# Patient Record
Sex: Male | Born: 1945
Health system: Southern US, Community
[De-identification: ages and names within clinical notes are randomized; demographics above are authoritative.]

## PROBLEM LIST (undated history)

## (undated) DIAGNOSIS — I2510 Atherosclerotic heart disease of native coronary artery without angina pectoris: Secondary | ICD-10-CM

## (undated) DIAGNOSIS — E785 Hyperlipidemia, unspecified: Secondary | ICD-10-CM

## (undated) DIAGNOSIS — I10 Essential (primary) hypertension: Secondary | ICD-10-CM

## (undated) DIAGNOSIS — E119 Type 2 diabetes mellitus without complications: Secondary | ICD-10-CM

## (undated) DIAGNOSIS — C801 Malignant (primary) neoplasm, unspecified: Secondary | ICD-10-CM

## (undated) HISTORY — DX: Hyperlipidemia, unspecified: E78.5

## (undated) HISTORY — PX: HERNIA REPAIR: SHX51

## (undated) HISTORY — DX: Essential (primary) hypertension: I10

## (undated) HISTORY — PX: CARDIAC CATHETERIZATION: SHX172

## (undated) HISTORY — DX: Type 2 diabetes mellitus without complications: E11.9

## (undated) HISTORY — PX: SHOULDER SURGERY: SHX246

## (undated) HISTORY — PX: BLADDER SURGERY: SHX569

## (undated) HISTORY — DX: Malignant (primary) neoplasm, unspecified: C80.1

## (undated) HISTORY — DX: Atherosclerotic heart disease of native coronary artery without angina pectoris: I25.10

---

## 2001-09-13 ENCOUNTER — Encounter (INDEPENDENT_AMBULATORY_CARE_PROVIDER_SITE_OTHER): Payer: Self-pay | Admitting: Specialist

## 2001-09-13 ENCOUNTER — Ambulatory Visit (HOSPITAL_COMMUNITY): Admission: RE | Admit: 2001-09-13 | Discharge: 2001-09-13 | Payer: Self-pay | Admitting: *Deleted

## 2004-10-10 ENCOUNTER — Ambulatory Visit (HOSPITAL_COMMUNITY): Admission: RE | Admit: 2004-10-10 | Discharge: 2004-10-10 | Payer: Self-pay | Admitting: *Deleted

## 2008-08-14 ENCOUNTER — Encounter: Admission: RE | Admit: 2008-08-14 | Discharge: 2008-08-14 | Payer: Self-pay | Admitting: General Surgery

## 2008-08-17 ENCOUNTER — Ambulatory Visit (HOSPITAL_BASED_OUTPATIENT_CLINIC_OR_DEPARTMENT_OTHER): Admission: RE | Admit: 2008-08-17 | Discharge: 2008-08-17 | Payer: Self-pay | Admitting: General Surgery

## 2010-08-30 ENCOUNTER — Ambulatory Visit (HOSPITAL_BASED_OUTPATIENT_CLINIC_OR_DEPARTMENT_OTHER)
Admission: RE | Admit: 2010-08-30 | Discharge: 2010-08-30 | Disposition: A | Discharge status: Home / Self Care | Payer: Medicare Other | Source: Ambulatory Visit | Attending: Urology | Admitting: Urology

## 2010-08-30 ENCOUNTER — Other Ambulatory Visit: Payer: Self-pay | Admitting: Urology

## 2010-08-30 DIAGNOSIS — Z01810 Encounter for preprocedural cardiovascular examination: Secondary | ICD-10-CM | POA: Insufficient documentation

## 2010-08-30 DIAGNOSIS — Z01812 Encounter for preprocedural laboratory examination: Secondary | ICD-10-CM | POA: Insufficient documentation

## 2010-08-30 DIAGNOSIS — C679 Malignant neoplasm of bladder, unspecified: Secondary | ICD-10-CM | POA: Insufficient documentation

## 2010-08-30 LAB — POCT I-STAT 4, (NA,K, GLUC, HGB,HCT)
Glucose, Bld: 92 mg/dL (ref 70–99)
Hemoglobin: 15.3 g/dL (ref 13.0–17.0)
Potassium: 3.8 mEq/L (ref 3.5–5.1)

## 2010-08-30 LAB — GLUCOSE, CAPILLARY: Glucose-Capillary: 117 mg/dL — ABNORMAL HIGH (ref 70–99)

## 2010-09-02 NOTE — Op Note (Signed)
  NAME:  Logan Gonzales, FERRARIS NO.:  [id number]  MEDICAL RECORD NO.:  [id number]           PATIENT TYPE:  LOCATION:                                 FACILITY:  PHYSICIAN:  Maretta Bees. Vonita Moss, M.D.     DATE OF BIRTH:  DATE OF PROCEDURE:  08/30/2010 DATE OF DISCHARGE:                              OPERATIVE REPORT   PREOPERATIVE DIAGNOSIS:  Bladder carcinoma.  POSTOPERATIVE DIAGNOSIS:  Bladder carcinoma.  PROCEDURE:  Cystoscopy and transurethral resection of large bladder tumor and right retrograde pyelogram with interpretation.  SURGEON:  Maretta Bees. Vonita Moss, MD  ANESTHESIA:  General.  INDICATIONS:  This gentleman has had persistent microhematuria and a CT scan with contrast revealed normal upper tracts, but he had a large filling defect on the right bladder base.  There was no hydronephrosis. This was suspected to be a bladder carcinoma.  PROCEDURE:  The patient was brought to the operating room and placed inlithotomy position.  The external genitalia were prepped and draped in the usual fashion.  He was cystoscoped.  The anterior urethra was normal.  He had partial prostatic obstruction.  The bladder had mild trabeculation.  The ureteral orifices were normal.  Just lateral to the right ureteral orifice was a very large classical papillary looking tumor and fortunately, it appeared to have a narrow base.  It measured about 5 cm in size.  I then inserted the resectoscope after dilating the urethra to 30-French.  Fortunately, the tumor had a narrow stalk as suspected and I was able to resect this tumor at the base and then remove it through the resectoscope sheath.  I thoroughly fulgurated the biopsy site and surrounding mucosa.  At this point, there was excellent hemostasis and no evidence of residual tumor.  He only had about 1 cm mucosal lesion.  I felt that the ureter was fine, but nonetheless decided to do a retrograde pyelogram to be sure.  Using the cystoscope  and a cone-tip catheter inserted through the scope, I performed a right retrograde pyelogram that showed a delicate nonobstructed ureter with no filling defects and delicate calices.  At this point, the bladder was emptied, the scope was removed.  Blood loss was about 25 cc at most and he tolerated the procedure well.  He was taken to the recovery room in good condition.  Unfortunately, there is a back order on mitomycin C and I would have used that today, but I did not feel it was worth waiting for the medication to proceed with surgery.  Fortunately, with this small resected area, I believe we will probably be fine.     Maretta Bees. Vonita Moss, M.D.     LJP/MEDQ  D:  08/30/2010  T:  08/30/2010  Job:  [id number]  cc:   Brooke Bonito, M.D. Fax: [phone]  Electronically Signed by Larey Dresser M.D. on 09/02/2010 11:35:14 AM

## 2010-10-14 ENCOUNTER — Other Ambulatory Visit: Payer: Self-pay | Admitting: Urology

## 2010-10-14 ENCOUNTER — Ambulatory Visit (HOSPITAL_BASED_OUTPATIENT_CLINIC_OR_DEPARTMENT_OTHER)
Admission: RE | Admit: 2010-10-14 | Discharge: 2010-10-14 | Disposition: A | Discharge status: Home / Self Care | Payer: Medicare Other | Source: Ambulatory Visit | Attending: Urology | Admitting: Urology

## 2010-10-14 DIAGNOSIS — Z7982 Long term (current) use of aspirin: Secondary | ICD-10-CM | POA: Insufficient documentation

## 2010-10-14 DIAGNOSIS — E7800 Pure hypercholesterolemia, unspecified: Secondary | ICD-10-CM | POA: Insufficient documentation

## 2010-10-14 DIAGNOSIS — E119 Type 2 diabetes mellitus without complications: Secondary | ICD-10-CM | POA: Insufficient documentation

## 2010-10-14 DIAGNOSIS — Z01812 Encounter for preprocedural laboratory examination: Secondary | ICD-10-CM | POA: Insufficient documentation

## 2010-10-14 DIAGNOSIS — C679 Malignant neoplasm of bladder, unspecified: Secondary | ICD-10-CM | POA: Insufficient documentation

## 2010-10-14 DIAGNOSIS — I10 Essential (primary) hypertension: Secondary | ICD-10-CM | POA: Insufficient documentation

## 2010-10-14 DIAGNOSIS — Z79899 Other long term (current) drug therapy: Secondary | ICD-10-CM | POA: Insufficient documentation

## 2010-10-14 LAB — POCT I-STAT 4, (NA,K, GLUC, HGB,HCT)
Glucose, Bld: 114 mg/dL — ABNORMAL HIGH (ref 70–99)
HCT: 42 % (ref 39.0–52.0)
Sodium: 141 mEq/L (ref 135–145)

## 2010-10-18 NOTE — Op Note (Signed)
  NAME:  Logan Gonzales, Logan Gonzales               ACCOUNT NO.:  [id number]  MEDICAL RECORD NO.:  [id number]          PATIENT TYPE:  AMB  LOCATION:                               FACILITY:  [facility]  PHYSICIAN:  Maretta Bees. Vonita Moss, M.D.DATE OF BIRTH:  1945/08/23  DATE OF PROCEDURE:  10/14/2010 DATE OF DISCHARGE:                              OPERATIVE REPORT   PREOPERATIVE DIAGNOSIS:  Bladder carcinoma.  POSTOPERATIVE DIAGNOSIS:  Bladder carcinoma.  PROCEDURE:  Cystoscopy, cold cup bladder biopsy and resection of bladder tumors (1 cm on the right lateral floor and 1 cm over left intramural ureter), left ureteroscopy and instillation of mitomycin-C.  SURGEON:  Maretta Bees. Vonita Moss, MD  ANESTHESIA:  General.  INDICATIONS:  This 65 year old gentleman was worked up for persistent microhematuria and found to have a fairly long, large papillary bladder tumor on the right bladder floor that was resected in early February. He is brought back to the OR today to restage this tumor for deeper biopsies in the lamina propria to rule out any invasion.  PROCEDURE IN DETAIL:  The patient was brought to the operating room, placed in lithotomy position, and the external genitalia prepped and draped in the usual fashion.  He was cystoscoped and the anterior urethra was normal.  The prostatic urethra was unremarkable.  There was an obvious scar in the right lateral bladder floor beyond the right ureteral orifice where the previous tumor was resected.  There was 1 tiny mucosal area that could represent a small papillary area.  In any case, this previous biopsy site was biopsied with the cold cup bladder biopsy forceps and then fulgurated with the Bugbee electrode. Observation of the rest of the bladder revealed a new papillary tumor over the course of the left intramural ureter.  It was approximately 1 cm in size and prior to resection, I inserted a 3-French ureteral catheter up the left ureteral orifice.  I then used  the cold cup bladder biopsy forceps to completely resect this papillary tumor and also I used the Bugbee electrode to fulgurate the base and biopsy site of this lesion.  There was no evidence of any residual tumor.  I then removed the cystoscope and inserted a 6-French rigid ureteroscope.  Then after removing the ureteral catheter, I was easily able to visualize the intramural ureter with no evidence of papillary tumor.  The ureteroscope was then removed. A 16-French Foley catheter was inserted for instillation of 40 mg of mitomycin-C in the PACU.  Blood loss was essentially zero and he tolerated the procedure well.     Maretta Bees. Vonita Moss, M.D.     LJP/MEDQ  D:  10/14/2010  T:  10/14/2010  Job:  [id number]  cc:   Brooke Bonito, M.D. Fax: [phone]  Electronically Signed by Larey Dresser M.D. on 10/18/2010 08:13:23 PM

## 2010-10-31 LAB — COMPREHENSIVE METABOLIC PANEL
BUN: 20 mg/dL (ref 6–23)
CO2: 27 mEq/L (ref 19–32)
Calcium: 9.4 mg/dL (ref 8.4–10.5)
Chloride: 103 mEq/L (ref 96–112)
Creatinine, Ser: 0.95 mg/dL (ref 0.4–1.5)
GFR calc Af Amer: >60 mL/min (ref >60)
GFR calc non Af Amer: >60 mL/min (ref >60)
Total Bilirubin: 0.8 mg/dL (ref 0.3–1.2)

## 2010-10-31 LAB — DIFFERENTIAL
Basophils Absolute: 0 10*3/uL (ref 0.0–0.1)
Lymphocytes Relative: 31 % (ref 12–46)
Lymphs Abs: 1.3 10*3/uL (ref 0.7–4.0)
Neutro Abs: 2.6 10*3/uL (ref 1.7–7.7)
Neutrophils Relative %: 60 % (ref 43–77)

## 2010-10-31 LAB — CBC
HCT: 44.6 % (ref 39.0–52.0)
MCHC: 33.8 g/dL (ref 30.0–36.0)
MCV: 92.7 fL (ref 78.0–100.0)
RBC: 4.82 MIL/uL (ref 4.22–5.81)
WBC: 4.3 10*3/uL (ref 4.0–10.5)

## 2010-11-01 LAB — GLUCOSE, CAPILLARY
Glucose-Capillary: 112 mg/dL — ABNORMAL HIGH (ref 70–99)
Glucose-Capillary: 160 mg/dL — ABNORMAL HIGH (ref 70–99)
Glucose-Capillary: 166 mg/dL — ABNORMAL HIGH (ref 70–99)

## 2010-11-29 NOTE — Op Note (Signed)
NAMELATHYN, GRIGGS NO.:  [id number]   MEDICAL RECORD NO.:  [id number]          PATIENT TYPE:  AMB   LOCATION:  DSC                          FACILITY:  [facility]   PHYSICIAN:  Anselm Pancoast. Weatherly, M.D.DATE OF BIRTH:  December 21, 1945   DATE OF PROCEDURE:  08/17/2008  DATE OF DISCHARGE:                               OPERATIVE REPORT   PREOPERATIVE DIAGNOSIS:  Right inguinal hernia, probably a pantaloon  umbilical hernia and then 3 lipomas anterior chest.   OPERATION:  Repair of right inguinal hernia, is pantaloon direct and  indirect combination, repair of umbilical hernia and excision of 3  lipomas.   ANESTHESIA:  General anesthesia.   SURGEON:  Anselm Pancoast. Zachery Dakins, MD   ASSISTANT:  Nurse.   HISTORY:  Logan Gonzales is a 65 year old male referred to me by Dr.  Juleen China for some bulges.  I had removed multiple lipomas in the minor  surgery room approximately in 2005 and he had a mass in the right groin  that he thought was possibly a large lipoma.  Dr. Juleen China said it was  obviously a hernia and the patient's weight has gained up to about 238  pounds at this time.  He also does have some lipomas over the anterior  lower chest sternal area that are mildly symptomatic and on physical  exam he has got about 50% bulge at the umbilicus in addition to the much  larger hernia in the right groin.  I recommended that we repair the  hernias with general anesthesia with mesh and he is here for the planned  procedure.  We will remove the lipoma simultaneously.  The patient was  given a gram of Ancef.  He is a diabetic, on oral medications and was  taken back to the operative suite after induction of general anesthesia  and endotracheal tube.  The areas had been marked with the hernias,  lipomas were encircled and we prepped with Betadine surgical scrub and  solution and draped widely in a sterile manner.  I had used towels to  start with the right inguinal hernia and the patient's  incision was made  after the ilioinguinal nerve block had been made with approximately 10  mL of 0.5% Marcaine with adrenaline.  Sharp dissection down through the  adipose tissue identifying the external oblique.  There were 2  superficial veins that were clamped, divided, and ligated with Vicryl.  The external oblique was then opened up and the cord structures were  elevated and a Penrose placed under it.  He has a lot of fatty material  in the cords but also an indirect hernia about 4 inches or so in length  and this was separated from the cord structures.  The hernia sac opened.  The fatty tissue that had prolapsed or herniated down was placed back  into peritoneal cavity and 0 Surgilon was used to do a high sac ligation  and a second suture just distal to the first.  I then anesthetized the  peritoneum, removed the hernia sac, and then elected to remove a  little  bit of the fatty tissue from the cord structures, but the veins and  vessels were all such that I felt that it would most likely cause  testicular swelling if we try to really skeletonize it.  So, I divided  the cremaster fibers and then repaired the floor which was kind of early  direct hernia with a running 2-0 Prolene, recreating the internal ring,  then going back tying the second layer with the similar 2-0 Prolene.  Next, a piece of Prolene mesh, shaped like a sail and slit  laterally  was used.  The ilioinguinal nerve and iliohypogastric nerves had all  been protected and the mesh was placed under these starting at the  symphysis pubis, suturing the inferior limb with a running 2-0 Prolene  and 2 tails around the internal ring sutured together laterally, and the  mesh was lying under the hypogastric nerve.  The superior flap was  sutured down with interrupted sutures of 2-0 Prolene, then the external  oblique was closed with a running 2-0 Vicryl.  I did place Marcaine in  the floor in addition to the ilioinguinal nerve  area.  The subcutaneous  Scarpa fascia was closed with interrupted 3-0 Vicryl and then 5-0 Dexon  subcuticular and Benzoin and Steri-Strips on the skin.  At the  completion of surgery, the testicle was in its normal position.  Next, I  made a little incision below the umbilicus and the herniated fatty  tissue was separated.  I did not actually go into the true peritoneum  but removed the preperitoneal fat and little pedicles were tied with 3-0  Vicryl and then I used a little piece of the Prolene mesh about 2 inches  to suture up under the fascia with 0 Surgilon sutures laterally and  inferior and then closed the little fascial defect over this  incorporating the little bit of mesh in the midline to reinforce and  bolster.  The subcutaneous tissue was closed with 3-0 Vicryl and then  some interrupted 5-0 Vicryl subcuticular and Steri-Strips on the  umbilicus.  Next, the lipomas and there were basically 3, turned out  there were 4.  One was to the left and slightly below the lowest rib  cage area and it was removed with a little skin incision and just kind  of dissecting the area out and the little vessels were coagulated and  then this was closed with 5-0 Vicryl subcuticular interrupted sutures  and then the other area were actually 3 that I used a single incision,  the incision was about an inch and a half and all of these were removed  and then the subcutaneous tissue was closed with 4-0 Vicryl and then  some interrupted 5-0 Vicryl and Benzoin and Steri-Strips on the skin  incision.  The patient tolerated the procedure nicely.  I had placed a  little bit of Marcaine in the lipoma incisions, and he was then  extubated and sent to recovery room in stable postop condition.  He will  be discharged in a short time and I will see him back in the office in  approximately 10 days.           ______________________________  Anselm Pancoast. Zachery Dakins, M.D.     WJW/MEDQ  D:  08/17/2008  T:   08/17/2008  Job:  [id number]   cc:   Brooke Bonito, M.D.

## 2010-12-02 NOTE — Procedures (Signed)
[facility]  Patient:    Logan Gonzales, Logan Gonzales Visit Number: [id number] MRN: [id number]          Service Type: END Location: ENDO Attending Physician:  Sabino Gasser Dictated by:   Sabino Gasser, M.D. Admit Date:  09/13/2001                             Procedure Report  PROCEDURE:  Colonoscopy.  SURGEON:  Sabino Gasser, M.D.  INDICATIONS:  Colon cancer screening, polyp at 30 cm.  ANESTHESIA:  Demerol 90 and Versed 9 mg.  DESCRIPTION OF PROCEDURE:  With the patient mildly sedated in the left lateral decubitus position, the Olympus videoscopic colonoscope was inserted into the rectum and passed under direct vision to the cecum, identified by the ileocecal valve and appendiceal orifice, both of which were photographed. From this point, the colonoscope was slowly withdrawn, taking circumferential views of the entire colonic mucosa, stopping to photograph diverticula seen along the way which was fairly mild in the sigmoid colon and a small polyp that was photographed and removed using hot biopsy forceps technique, setting at 3 and 3 blunted current.  The endoscope was withdrawn all the way to the rectum which appeared normal on direct and retroflexed view.  The endoscope was straightened and withdrawn.  The patients vital signs and pulse oximeter remained stable.  The patient tolerated the procedure well without apparent complications.  FINDINGS:  Rare diverticulum of the sigmoid colon, small polyp at 30 cm, otherwise unremarkable examination.  PLAN:  Repeat examination in approximately three to five years. Dictated by:   Sabino Gasser, M.D. Attending Physician:  Sabino Gasser DD:  09/13/01 TD:  09/13/01 Job: [id number]

## 2010-12-02 NOTE — Op Note (Signed)
NAMEELRIDGE, STEMM NO.:  [id number]   MEDICAL RECORD NO.:  [id number]          PATIENT TYPE:  AMB   LOCATION:  ENDO                         FACILITY:  [facility]   PHYSICIAN:  Georgiana Spinner, M.D.    DATE OF BIRTH:  Sep 16, 1945   DATE OF PROCEDURE:  10/10/2004  DATE OF DISCHARGE:                                 OPERATIVE REPORT   PROCEDURE:  Colonoscopy.   ENDOSCOPIST:  Georgiana Spinner, M.D.   INDICATIONS FOR PROCEDURE:  Polyps.   ANESTHESIA:  Demerol 80 mg, Versed 8 mg.   DESCRIPTION OF PROCEDURE:  With the patient mildly sedated in the left  lateral decubitus position, a rectal examination was performed which was  unremarkable.  Subsequently the Olympus videoscopic colonoscope was inserted  into the rectum and passed under direct vision to the cecum, identified by  the ileocecal valve and appendiceal orifice, both of which were  photographed.  From this point the colonoscope was slowly withdrawn, taking  circumferential views of the colonic mucosa, stopping in the rectum, which  appeared normal on direct and retroflexed view.  The endoscope was  straightened and withdrawn.  The patient's vital signs and pulse oximeter  remained stable.   The patient tolerated the procedure well without apparent complications.   FINDINGS:  Unremarkable examination.   PLAN:  Repeat examination in five years.      GMO/MEDQ  D:  10/10/2004  T:  10/10/2004  Job:  [id number]

## 2012-07-21 ENCOUNTER — Ambulatory Visit (INDEPENDENT_AMBULATORY_CARE_PROVIDER_SITE_OTHER): Payer: Medicare Other | Admitting: Internal Medicine

## 2012-07-21 VITALS — BP 148/76 | HR 71 | Temp 98.3°F | Resp 18 | Ht 73.5 in | Wt 212.6 lb

## 2012-07-21 DIAGNOSIS — E119 Type 2 diabetes mellitus without complications: Secondary | ICD-10-CM

## 2012-07-21 DIAGNOSIS — J40 Bronchitis, not specified as acute or chronic: Secondary | ICD-10-CM

## 2012-07-21 MED ORDER — HYDROCODONE-HOMATROPINE 5-1.5 MG/5ML PO SYRP: 5.0000 mL | ORAL_SOLUTION | Freq: Four times a day (QID) | ORAL | Status: AC | PRN

## 2012-07-21 MED ORDER — CIPROFLOXACIN HCL 500 MG PO TABS: 500.0000 mg | ORAL_TABLET | Freq: Two times a day (BID) | ORAL | Status: DC

## 2012-07-21 NOTE — Patient Instructions (Addendum)

## 2012-07-21 NOTE — Progress Notes (Signed)
  Subjective:    Patient ID: Logan Gonzales, male    DOB: 1945-08-18, 67 y.o.   MRN: [id number]  HPI two-day history of a productive cough with fever on medicines for diabetes Has had the flu shot No sinus symptoms/no sore throat/concerned that he will get pneumonia     Review of Systems No night sweats No nausea vomiting No skin rash No headache No myalgias    Objective:   Physical Exam TMs clear/nares clear/throat clear Chest with rhonchi at the right base that clear with coughing No wheezing with forced expiration Heart regular/no murmur       Assessment & Plan:  Problem 1 probable flu modified by flu shot Problem 2 ? Bronchitis He prefers cipro /past problems with zith hycodan

## 2012-07-23 ENCOUNTER — Telehealth: Payer: Self-pay

## 2012-07-23 MED ORDER — BENZONATATE 100 MG PO CAPS: 100.0000 mg | ORAL_CAPSULE | Freq: Three times a day (TID) | ORAL | Status: DC | PRN

## 2012-07-23 NOTE — Telephone Encounter (Signed)
AMY ASKED PATIENT TO CALL BACK IF HE IS NOT FEELING BETTER AND HE IS CALLING BACK.  BEST# [phone]

## 2012-07-23 NOTE — Telephone Encounter (Signed)
Rx'ed tessalon perles. We cannot prescribe another narcotic cough syrup.  He should complete they antibiotics as prescribed unless symptoms are worsening, and then he would need to RTC for further evaluation

## 2012-07-23 NOTE — Telephone Encounter (Signed)
Patient advised.

## 2012-07-23 NOTE — Telephone Encounter (Signed)
Called patient, to see how he is feeling, he is c/o increased cough and has had no relief. He wants to know if he can try another cough medication, the Hycodan does not help much. He requested Cipro because the Z pack did not help him much in the past. He is asking also if he can try a different antibiotic, please advise.

## 2013-04-30 ENCOUNTER — Other Ambulatory Visit: Payer: Self-pay | Admitting: Gastroenterology

## 2013-08-05 ENCOUNTER — Ambulatory Visit: Payer: Medicare Other

## 2015-09-13 ENCOUNTER — Ambulatory Visit: Payer: Self-pay | Admitting: Surgery

## 2016-08-22 DIAGNOSIS — H903 Sensorineural hearing loss, bilateral: Secondary | ICD-10-CM | POA: Diagnosis not present

## 2016-09-28 DIAGNOSIS — E789 Disorder of lipoprotein metabolism, unspecified: Secondary | ICD-10-CM | POA: Diagnosis not present

## 2016-09-28 DIAGNOSIS — E118 Type 2 diabetes mellitus with unspecified complications: Secondary | ICD-10-CM | POA: Diagnosis not present

## 2016-10-02 DIAGNOSIS — C678 Malignant neoplasm of overlapping sites of bladder: Secondary | ICD-10-CM | POA: Diagnosis not present

## 2016-10-05 DIAGNOSIS — I10 Essential (primary) hypertension: Secondary | ICD-10-CM | POA: Diagnosis not present

## 2016-10-05 DIAGNOSIS — E118 Type 2 diabetes mellitus with unspecified complications: Secondary | ICD-10-CM | POA: Diagnosis not present

## 2016-10-05 DIAGNOSIS — E789 Disorder of lipoprotein metabolism, unspecified: Secondary | ICD-10-CM | POA: Diagnosis not present

## 2017-01-02 DIAGNOSIS — E789 Disorder of lipoprotein metabolism, unspecified: Secondary | ICD-10-CM | POA: Diagnosis not present

## 2017-01-02 DIAGNOSIS — I10 Essential (primary) hypertension: Secondary | ICD-10-CM | POA: Diagnosis not present

## 2017-01-02 DIAGNOSIS — E118 Type 2 diabetes mellitus with unspecified complications: Secondary | ICD-10-CM | POA: Diagnosis not present

## 2017-01-09 DIAGNOSIS — E118 Type 2 diabetes mellitus with unspecified complications: Secondary | ICD-10-CM | POA: Diagnosis not present

## 2017-01-09 DIAGNOSIS — E789 Disorder of lipoprotein metabolism, unspecified: Secondary | ICD-10-CM | POA: Diagnosis not present

## 2017-01-19 DIAGNOSIS — D239 Other benign neoplasm of skin, unspecified: Secondary | ICD-10-CM | POA: Diagnosis not present

## 2017-01-19 DIAGNOSIS — D1801 Hemangioma of skin and subcutaneous tissue: Secondary | ICD-10-CM | POA: Diagnosis not present

## 2017-01-19 DIAGNOSIS — L821 Other seborrheic keratosis: Secondary | ICD-10-CM | POA: Diagnosis not present

## 2017-01-19 DIAGNOSIS — L814 Other melanin hyperpigmentation: Secondary | ICD-10-CM | POA: Diagnosis not present

## 2017-01-19 DIAGNOSIS — L570 Actinic keratosis: Secondary | ICD-10-CM | POA: Diagnosis not present

## 2017-01-19 DIAGNOSIS — L820 Inflamed seborrheic keratosis: Secondary | ICD-10-CM | POA: Diagnosis not present

## 2017-04-11 DIAGNOSIS — C678 Malignant neoplasm of overlapping sites of bladder: Secondary | ICD-10-CM | POA: Diagnosis not present

## 2017-04-11 DIAGNOSIS — N401 Enlarged prostate with lower urinary tract symptoms: Secondary | ICD-10-CM | POA: Diagnosis not present

## 2017-04-11 DIAGNOSIS — R351 Nocturia: Secondary | ICD-10-CM | POA: Diagnosis not present

## 2017-04-27 DIAGNOSIS — Z23 Encounter for immunization: Secondary | ICD-10-CM | POA: Diagnosis not present

## 2017-05-07 DIAGNOSIS — E118 Type 2 diabetes mellitus with unspecified complications: Secondary | ICD-10-CM | POA: Diagnosis not present

## 2017-05-07 DIAGNOSIS — I10 Essential (primary) hypertension: Secondary | ICD-10-CM | POA: Diagnosis not present

## 2017-05-24 DIAGNOSIS — I10 Essential (primary) hypertension: Secondary | ICD-10-CM | POA: Diagnosis not present

## 2017-05-24 DIAGNOSIS — E789 Disorder of lipoprotein metabolism, unspecified: Secondary | ICD-10-CM | POA: Diagnosis not present

## 2017-05-24 DIAGNOSIS — E118 Type 2 diabetes mellitus with unspecified complications: Secondary | ICD-10-CM | POA: Diagnosis not present

## 2017-06-11 DIAGNOSIS — H109 Unspecified conjunctivitis: Secondary | ICD-10-CM | POA: Diagnosis not present

## 2017-07-13 DIAGNOSIS — H5201 Hypermetropia, right eye: Secondary | ICD-10-CM | POA: Diagnosis not present

## 2017-07-13 DIAGNOSIS — H25013 Cortical age-related cataract, bilateral: Secondary | ICD-10-CM | POA: Diagnosis not present

## 2017-07-13 DIAGNOSIS — E119 Type 2 diabetes mellitus without complications: Secondary | ICD-10-CM | POA: Diagnosis not present

## 2017-07-13 DIAGNOSIS — H5212 Myopia, left eye: Secondary | ICD-10-CM | POA: Diagnosis not present

## 2017-07-31 DIAGNOSIS — E118 Type 2 diabetes mellitus with unspecified complications: Secondary | ICD-10-CM | POA: Diagnosis not present

## 2017-07-31 DIAGNOSIS — I10 Essential (primary) hypertension: Secondary | ICD-10-CM | POA: Diagnosis not present

## 2017-07-31 DIAGNOSIS — Z125 Encounter for screening for malignant neoplasm of prostate: Secondary | ICD-10-CM | POA: Diagnosis not present

## 2017-08-07 DIAGNOSIS — Z0001 Encounter for general adult medical examination with abnormal findings: Secondary | ICD-10-CM | POA: Diagnosis not present

## 2017-08-07 DIAGNOSIS — Z86010 Personal history of colonic polyps: Secondary | ICD-10-CM | POA: Diagnosis not present

## 2017-08-07 DIAGNOSIS — Z8551 Personal history of malignant neoplasm of bladder: Secondary | ICD-10-CM | POA: Diagnosis not present

## 2017-08-07 DIAGNOSIS — M5136 Other intervertebral disc degeneration, lumbar region: Secondary | ICD-10-CM | POA: Diagnosis not present

## 2017-08-07 DIAGNOSIS — E7800 Pure hypercholesterolemia, unspecified: Secondary | ICD-10-CM | POA: Diagnosis not present

## 2017-08-07 DIAGNOSIS — E1136 Type 2 diabetes mellitus with diabetic cataract: Secondary | ICD-10-CM | POA: Diagnosis not present

## 2017-08-07 DIAGNOSIS — I10 Essential (primary) hypertension: Secondary | ICD-10-CM | POA: Diagnosis not present

## 2017-08-07 DIAGNOSIS — M7741 Metatarsalgia, right foot: Secondary | ICD-10-CM | POA: Diagnosis not present

## 2017-08-14 DIAGNOSIS — Z23 Encounter for immunization: Secondary | ICD-10-CM | POA: Diagnosis not present

## 2017-08-14 DIAGNOSIS — S41111A Laceration without foreign body of right upper arm, initial encounter: Secondary | ICD-10-CM | POA: Diagnosis not present

## 2017-09-17 DIAGNOSIS — R351 Nocturia: Secondary | ICD-10-CM | POA: Diagnosis not present

## 2017-09-17 DIAGNOSIS — N401 Enlarged prostate with lower urinary tract symptoms: Secondary | ICD-10-CM | POA: Diagnosis not present

## 2017-09-17 DIAGNOSIS — C678 Malignant neoplasm of overlapping sites of bladder: Secondary | ICD-10-CM | POA: Diagnosis not present

## 2017-09-24 DIAGNOSIS — J399 Disease of upper respiratory tract, unspecified: Secondary | ICD-10-CM | POA: Diagnosis not present

## 2017-09-24 DIAGNOSIS — I10 Essential (primary) hypertension: Secondary | ICD-10-CM | POA: Diagnosis not present

## 2017-09-24 DIAGNOSIS — J22 Unspecified acute lower respiratory infection: Secondary | ICD-10-CM | POA: Diagnosis not present

## 2017-09-24 DIAGNOSIS — E1136 Type 2 diabetes mellitus with diabetic cataract: Secondary | ICD-10-CM | POA: Diagnosis not present

## 2017-09-24 DIAGNOSIS — J329 Chronic sinusitis, unspecified: Secondary | ICD-10-CM | POA: Diagnosis not present

## 2017-09-26 ENCOUNTER — Other Ambulatory Visit: Payer: Self-pay | Admitting: Plastic Surgery

## 2017-09-26 DIAGNOSIS — C44519 Basal cell carcinoma of skin of other part of trunk: Secondary | ICD-10-CM | POA: Diagnosis not present

## 2017-09-26 DIAGNOSIS — D485 Neoplasm of uncertain behavior of skin: Secondary | ICD-10-CM | POA: Diagnosis not present

## 2017-10-08 DIAGNOSIS — C4491 Basal cell carcinoma of skin, unspecified: Secondary | ICD-10-CM | POA: Diagnosis not present

## 2017-10-16 ENCOUNTER — Other Ambulatory Visit: Payer: Self-pay | Admitting: Plastic Surgery

## 2017-10-16 DIAGNOSIS — G8929 Other chronic pain: Secondary | ICD-10-CM | POA: Diagnosis not present

## 2017-10-16 DIAGNOSIS — M545 Low back pain: Secondary | ICD-10-CM | POA: Diagnosis not present

## 2017-10-16 DIAGNOSIS — C44519 Basal cell carcinoma of skin of other part of trunk: Secondary | ICD-10-CM | POA: Diagnosis not present

## 2017-12-06 DIAGNOSIS — E1136 Type 2 diabetes mellitus with diabetic cataract: Secondary | ICD-10-CM | POA: Diagnosis not present

## 2017-12-06 DIAGNOSIS — L255 Unspecified contact dermatitis due to plants, except food: Secondary | ICD-10-CM | POA: Diagnosis not present

## 2017-12-20 MED FILL — SHINGRIX VIAL KIT: 50 | 1 days supply | Qty: 1 | Fill #0

## 2018-02-22 MED FILL — SHINGRIX VIAL KIT: 50 | 1 days supply | Qty: 1 | Fill #1

## 2018-02-28 DIAGNOSIS — L239 Allergic contact dermatitis, unspecified cause: Secondary | ICD-10-CM | POA: Diagnosis not present

## 2018-03-25 DIAGNOSIS — N401 Enlarged prostate with lower urinary tract symptoms: Secondary | ICD-10-CM | POA: Diagnosis not present

## 2018-03-25 DIAGNOSIS — R351 Nocturia: Secondary | ICD-10-CM | POA: Diagnosis not present

## 2018-03-25 DIAGNOSIS — C678 Malignant neoplasm of overlapping sites of bladder: Secondary | ICD-10-CM | POA: Diagnosis not present

## 2018-04-11 DIAGNOSIS — Z23 Encounter for immunization: Secondary | ICD-10-CM | POA: Diagnosis not present

## 2018-04-11 DIAGNOSIS — E1136 Type 2 diabetes mellitus with diabetic cataract: Secondary | ICD-10-CM | POA: Diagnosis not present

## 2018-07-30 DIAGNOSIS — H43811 Vitreous degeneration, right eye: Secondary | ICD-10-CM | POA: Diagnosis not present

## 2018-07-30 DIAGNOSIS — H25813 Combined forms of age-related cataract, bilateral: Secondary | ICD-10-CM | POA: Diagnosis not present

## 2018-07-30 DIAGNOSIS — E119 Type 2 diabetes mellitus without complications: Secondary | ICD-10-CM | POA: Diagnosis not present

## 2018-08-02 DIAGNOSIS — D123 Benign neoplasm of transverse colon: Secondary | ICD-10-CM | POA: Diagnosis not present

## 2018-08-02 DIAGNOSIS — Z86010 Personal history of colonic polyps: Secondary | ICD-10-CM | POA: Diagnosis not present

## 2018-08-02 DIAGNOSIS — K635 Polyp of colon: Secondary | ICD-10-CM | POA: Diagnosis not present

## 2018-08-06 DIAGNOSIS — D123 Benign neoplasm of transverse colon: Secondary | ICD-10-CM | POA: Diagnosis not present

## 2018-08-06 DIAGNOSIS — K635 Polyp of colon: Secondary | ICD-10-CM | POA: Diagnosis not present

## 2018-08-22 DIAGNOSIS — I10 Essential (primary) hypertension: Secondary | ICD-10-CM | POA: Diagnosis not present

## 2018-08-22 DIAGNOSIS — Z125 Encounter for screening for malignant neoplasm of prostate: Secondary | ICD-10-CM | POA: Diagnosis not present

## 2018-08-22 DIAGNOSIS — E118 Type 2 diabetes mellitus with unspecified complications: Secondary | ICD-10-CM | POA: Diagnosis not present

## 2018-08-29 DIAGNOSIS — E118 Type 2 diabetes mellitus with unspecified complications: Secondary | ICD-10-CM | POA: Diagnosis not present

## 2018-08-29 DIAGNOSIS — Z0001 Encounter for general adult medical examination with abnormal findings: Secondary | ICD-10-CM | POA: Diagnosis not present

## 2018-08-29 DIAGNOSIS — I10 Essential (primary) hypertension: Secondary | ICD-10-CM | POA: Diagnosis not present

## 2018-09-02 DIAGNOSIS — E7800 Pure hypercholesterolemia, unspecified: Secondary | ICD-10-CM | POA: Diagnosis not present

## 2018-09-02 DIAGNOSIS — E118 Type 2 diabetes mellitus with unspecified complications: Secondary | ICD-10-CM | POA: Diagnosis not present

## 2018-09-02 DIAGNOSIS — I10 Essential (primary) hypertension: Secondary | ICD-10-CM | POA: Diagnosis not present

## 2018-09-16 DIAGNOSIS — R21 Rash and other nonspecific skin eruption: Secondary | ICD-10-CM | POA: Diagnosis not present

## 2018-09-16 DIAGNOSIS — E1136 Type 2 diabetes mellitus with diabetic cataract: Secondary | ICD-10-CM | POA: Diagnosis not present

## 2018-09-16 DIAGNOSIS — I10 Essential (primary) hypertension: Secondary | ICD-10-CM | POA: Diagnosis not present

## 2018-09-16 DIAGNOSIS — J0100 Acute maxillary sinusitis, unspecified: Secondary | ICD-10-CM | POA: Diagnosis not present

## 2018-10-01 DIAGNOSIS — E118 Type 2 diabetes mellitus with unspecified complications: Secondary | ICD-10-CM | POA: Diagnosis not present

## 2018-10-01 DIAGNOSIS — E7800 Pure hypercholesterolemia, unspecified: Secondary | ICD-10-CM | POA: Diagnosis not present

## 2018-10-01 DIAGNOSIS — R21 Rash and other nonspecific skin eruption: Secondary | ICD-10-CM | POA: Diagnosis not present

## 2018-10-01 DIAGNOSIS — I10 Essential (primary) hypertension: Secondary | ICD-10-CM | POA: Diagnosis not present

## 2018-10-07 DIAGNOSIS — C678 Malignant neoplasm of overlapping sites of bladder: Secondary | ICD-10-CM | POA: Diagnosis not present

## 2018-11-27 DIAGNOSIS — Z20828 Contact with and (suspected) exposure to other viral communicable diseases: Secondary | ICD-10-CM | POA: Diagnosis not present

## 2019-04-08 DIAGNOSIS — E118 Type 2 diabetes mellitus with unspecified complications: Secondary | ICD-10-CM | POA: Diagnosis not present

## 2019-04-08 DIAGNOSIS — E7800 Pure hypercholesterolemia, unspecified: Secondary | ICD-10-CM | POA: Diagnosis not present

## 2019-04-08 DIAGNOSIS — I10 Essential (primary) hypertension: Secondary | ICD-10-CM | POA: Diagnosis not present

## 2019-04-14 DIAGNOSIS — Z8551 Personal history of malignant neoplasm of bladder: Secondary | ICD-10-CM | POA: Diagnosis not present

## 2019-04-18 DIAGNOSIS — Z23 Encounter for immunization: Secondary | ICD-10-CM | POA: Diagnosis not present

## 2019-05-26 DIAGNOSIS — W11XXXA Fall on and from ladder, initial encounter: Secondary | ICD-10-CM | POA: Diagnosis not present

## 2019-05-26 DIAGNOSIS — S299XXA Unspecified injury of thorax, initial encounter: Secondary | ICD-10-CM | POA: Diagnosis not present

## 2019-05-26 DIAGNOSIS — R0781 Pleurodynia: Secondary | ICD-10-CM | POA: Diagnosis not present

## 2019-07-01 DIAGNOSIS — E7800 Pure hypercholesterolemia, unspecified: Secondary | ICD-10-CM | POA: Diagnosis not present

## 2019-07-01 DIAGNOSIS — I10 Essential (primary) hypertension: Secondary | ICD-10-CM | POA: Diagnosis not present

## 2019-07-01 DIAGNOSIS — E118 Type 2 diabetes mellitus with unspecified complications: Secondary | ICD-10-CM | POA: Diagnosis not present

## 2019-07-08 DIAGNOSIS — E7800 Pure hypercholesterolemia, unspecified: Secondary | ICD-10-CM | POA: Diagnosis not present

## 2019-07-08 DIAGNOSIS — Z8551 Personal history of malignant neoplasm of bladder: Secondary | ICD-10-CM | POA: Diagnosis not present

## 2019-07-08 DIAGNOSIS — I10 Essential (primary) hypertension: Secondary | ICD-10-CM | POA: Diagnosis not present

## 2019-07-08 DIAGNOSIS — K5900 Constipation, unspecified: Secondary | ICD-10-CM | POA: Diagnosis not present

## 2019-07-08 DIAGNOSIS — R21 Rash and other nonspecific skin eruption: Secondary | ICD-10-CM | POA: Diagnosis not present

## 2019-07-08 DIAGNOSIS — E875 Hyperkalemia: Secondary | ICD-10-CM | POA: Diagnosis not present

## 2019-07-08 DIAGNOSIS — E1136 Type 2 diabetes mellitus with diabetic cataract: Secondary | ICD-10-CM | POA: Diagnosis not present

## 2019-07-23 DIAGNOSIS — C44629 Squamous cell carcinoma of skin of left upper limb, including shoulder: Secondary | ICD-10-CM | POA: Diagnosis not present

## 2019-07-23 DIAGNOSIS — D485 Neoplasm of uncertain behavior of skin: Secondary | ICD-10-CM | POA: Diagnosis not present

## 2019-07-23 DIAGNOSIS — I8311 Varicose veins of right lower extremity with inflammation: Secondary | ICD-10-CM | POA: Diagnosis not present

## 2019-07-23 DIAGNOSIS — I8312 Varicose veins of left lower extremity with inflammation: Secondary | ICD-10-CM | POA: Diagnosis not present

## 2019-07-23 DIAGNOSIS — L821 Other seborrheic keratosis: Secondary | ICD-10-CM | POA: Diagnosis not present

## 2019-07-28 DIAGNOSIS — D485 Neoplasm of uncertain behavior of skin: Secondary | ICD-10-CM | POA: Diagnosis not present

## 2019-07-28 DIAGNOSIS — C44319 Basal cell carcinoma of skin of other parts of face: Secondary | ICD-10-CM | POA: Diagnosis not present

## 2019-07-28 DIAGNOSIS — C44629 Squamous cell carcinoma of skin of left upper limb, including shoulder: Secondary | ICD-10-CM | POA: Diagnosis not present

## 2019-07-31 DIAGNOSIS — C44319 Basal cell carcinoma of skin of other parts of face: Secondary | ICD-10-CM | POA: Diagnosis not present

## 2019-08-01 DIAGNOSIS — H524 Presbyopia: Secondary | ICD-10-CM | POA: Diagnosis not present

## 2019-08-01 DIAGNOSIS — H43811 Vitreous degeneration, right eye: Secondary | ICD-10-CM | POA: Diagnosis not present

## 2019-08-01 DIAGNOSIS — H52223 Regular astigmatism, bilateral: Secondary | ICD-10-CM | POA: Diagnosis not present

## 2019-08-01 DIAGNOSIS — H25813 Combined forms of age-related cataract, bilateral: Secondary | ICD-10-CM | POA: Diagnosis not present

## 2019-08-01 DIAGNOSIS — E119 Type 2 diabetes mellitus without complications: Secondary | ICD-10-CM | POA: Diagnosis not present

## 2019-09-01 DIAGNOSIS — I10 Essential (primary) hypertension: Secondary | ICD-10-CM | POA: Diagnosis not present

## 2019-09-01 DIAGNOSIS — Z0000 Encounter for general adult medical examination without abnormal findings: Secondary | ICD-10-CM | POA: Diagnosis not present

## 2019-09-01 DIAGNOSIS — E1136 Type 2 diabetes mellitus with diabetic cataract: Secondary | ICD-10-CM | POA: Diagnosis not present

## 2019-09-01 DIAGNOSIS — Z125 Encounter for screening for malignant neoplasm of prostate: Secondary | ICD-10-CM | POA: Diagnosis not present

## 2019-09-01 DIAGNOSIS — E7800 Pure hypercholesterolemia, unspecified: Secondary | ICD-10-CM | POA: Diagnosis not present

## 2019-09-17 DIAGNOSIS — Z03818 Encounter for observation for suspected exposure to other biological agents ruled out: Secondary | ICD-10-CM | POA: Diagnosis not present

## 2019-09-25 DIAGNOSIS — Z0000 Encounter for general adult medical examination without abnormal findings: Secondary | ICD-10-CM | POA: Diagnosis not present

## 2019-09-25 DIAGNOSIS — J411 Mucopurulent chronic bronchitis: Secondary | ICD-10-CM | POA: Diagnosis not present

## 2019-09-25 DIAGNOSIS — E7800 Pure hypercholesterolemia, unspecified: Secondary | ICD-10-CM | POA: Diagnosis not present

## 2019-09-25 DIAGNOSIS — Z125 Encounter for screening for malignant neoplasm of prostate: Secondary | ICD-10-CM | POA: Diagnosis not present

## 2019-09-25 DIAGNOSIS — M5136 Other intervertebral disc degeneration, lumbar region: Secondary | ICD-10-CM | POA: Diagnosis not present

## 2019-09-25 DIAGNOSIS — E1136 Type 2 diabetes mellitus with diabetic cataract: Secondary | ICD-10-CM | POA: Diagnosis not present

## 2019-09-25 DIAGNOSIS — I10 Essential (primary) hypertension: Secondary | ICD-10-CM | POA: Diagnosis not present

## 2019-12-02 DIAGNOSIS — I10 Essential (primary) hypertension: Secondary | ICD-10-CM | POA: Diagnosis not present

## 2019-12-02 DIAGNOSIS — Z79899 Other long term (current) drug therapy: Secondary | ICD-10-CM | POA: Diagnosis not present

## 2019-12-02 DIAGNOSIS — E1136 Type 2 diabetes mellitus with diabetic cataract: Secondary | ICD-10-CM | POA: Diagnosis not present

## 2019-12-02 DIAGNOSIS — E7800 Pure hypercholesterolemia, unspecified: Secondary | ICD-10-CM | POA: Diagnosis not present

## 2020-01-05 DIAGNOSIS — C678 Malignant neoplasm of overlapping sites of bladder: Secondary | ICD-10-CM | POA: Diagnosis not present

## 2020-01-05 DIAGNOSIS — N401 Enlarged prostate with lower urinary tract symptoms: Secondary | ICD-10-CM | POA: Diagnosis not present

## 2020-01-05 DIAGNOSIS — R351 Nocturia: Secondary | ICD-10-CM | POA: Diagnosis not present

## 2020-01-15 DIAGNOSIS — M545 Low back pain: Secondary | ICD-10-CM | POA: Diagnosis not present

## 2020-02-05 DIAGNOSIS — M545 Low back pain: Secondary | ICD-10-CM | POA: Diagnosis not present

## 2020-02-26 DIAGNOSIS — M545 Low back pain: Secondary | ICD-10-CM | POA: Diagnosis not present

## 2020-03-03 ENCOUNTER — Other Ambulatory Visit: Payer: Self-pay | Admitting: Orthopedic Surgery

## 2020-03-03 DIAGNOSIS — M545 Low back pain, unspecified: Secondary | ICD-10-CM

## 2020-03-23 DIAGNOSIS — E1136 Type 2 diabetes mellitus with diabetic cataract: Secondary | ICD-10-CM | POA: Diagnosis not present

## 2020-03-23 DIAGNOSIS — I10 Essential (primary) hypertension: Secondary | ICD-10-CM | POA: Diagnosis not present

## 2020-03-23 DIAGNOSIS — E7800 Pure hypercholesterolemia, unspecified: Secondary | ICD-10-CM | POA: Diagnosis not present

## 2020-03-26 ENCOUNTER — Other Ambulatory Visit: Payer: Self-pay

## 2020-03-28 ENCOUNTER — Other Ambulatory Visit: Payer: Self-pay

## 2020-03-29 ENCOUNTER — Other Ambulatory Visit: Payer: Self-pay

## 2020-03-29 ENCOUNTER — Ambulatory Visit
Admission: RE | Admit: 2020-03-29 | Discharge: 2020-03-29 | Disposition: A | Discharge status: Home / Self Care | Payer: PPO | Source: Ambulatory Visit | Attending: Orthopedic Surgery | Admitting: Orthopedic Surgery

## 2020-03-29 DIAGNOSIS — E1136 Type 2 diabetes mellitus with diabetic cataract: Secondary | ICD-10-CM | POA: Diagnosis not present

## 2020-03-29 DIAGNOSIS — M5136 Other intervertebral disc degeneration, lumbar region: Secondary | ICD-10-CM | POA: Diagnosis not present

## 2020-03-29 DIAGNOSIS — E7800 Pure hypercholesterolemia, unspecified: Secondary | ICD-10-CM | POA: Diagnosis not present

## 2020-03-29 DIAGNOSIS — Z23 Encounter for immunization: Secondary | ICD-10-CM | POA: Diagnosis not present

## 2020-03-29 DIAGNOSIS — K5900 Constipation, unspecified: Secondary | ICD-10-CM | POA: Diagnosis not present

## 2020-03-29 DIAGNOSIS — M545 Low back pain, unspecified: Secondary | ICD-10-CM

## 2020-03-29 DIAGNOSIS — M48061 Spinal stenosis, lumbar region without neurogenic claudication: Secondary | ICD-10-CM | POA: Diagnosis not present

## 2020-03-29 DIAGNOSIS — Z8551 Personal history of malignant neoplasm of bladder: Secondary | ICD-10-CM | POA: Diagnosis not present

## 2020-03-29 DIAGNOSIS — I10 Essential (primary) hypertension: Secondary | ICD-10-CM | POA: Diagnosis not present

## 2020-03-29 DIAGNOSIS — Z86010 Personal history of colonic polyps: Secondary | ICD-10-CM | POA: Diagnosis not present

## 2020-04-01 DIAGNOSIS — M5441 Lumbago with sciatica, right side: Secondary | ICD-10-CM | POA: Diagnosis not present

## 2020-04-01 DIAGNOSIS — M5442 Lumbago with sciatica, left side: Secondary | ICD-10-CM | POA: Diagnosis not present

## 2020-04-01 DIAGNOSIS — M545 Low back pain: Secondary | ICD-10-CM | POA: Diagnosis not present

## 2020-04-21 ENCOUNTER — Other Ambulatory Visit: Payer: Self-pay

## 2020-06-01 DIAGNOSIS — J90 Pleural effusion, not elsewhere classified: Secondary | ICD-10-CM | POA: Diagnosis not present

## 2020-06-01 DIAGNOSIS — E7800 Pure hypercholesterolemia, unspecified: Secondary | ICD-10-CM | POA: Diagnosis not present

## 2020-06-01 DIAGNOSIS — E118 Type 2 diabetes mellitus with unspecified complications: Secondary | ICD-10-CM | POA: Diagnosis not present

## 2020-06-01 DIAGNOSIS — J069 Acute upper respiratory infection, unspecified: Secondary | ICD-10-CM | POA: Diagnosis not present

## 2020-06-01 DIAGNOSIS — I10 Essential (primary) hypertension: Secondary | ICD-10-CM | POA: Diagnosis not present

## 2020-06-14 ENCOUNTER — Other Ambulatory Visit: Payer: Self-pay

## 2020-06-14 ENCOUNTER — Ambulatory Visit: Payer: PPO | Admitting: Cardiology

## 2020-06-14 ENCOUNTER — Encounter: Payer: Self-pay | Admitting: Cardiology

## 2020-06-14 VITALS — BP 139/67 | HR 68 | Resp 16 | Ht 73.5 in | Wt 212.0 lb

## 2020-06-14 DIAGNOSIS — I700 Atherosclerosis of aorta: Secondary | ICD-10-CM | POA: Diagnosis not present

## 2020-06-14 DIAGNOSIS — Z8249 Family history of ischemic heart disease and other diseases of the circulatory system: Secondary | ICD-10-CM | POA: Diagnosis not present

## 2020-06-14 DIAGNOSIS — R9431 Abnormal electrocardiogram [ECG] [EKG]: Secondary | ICD-10-CM

## 2020-06-14 DIAGNOSIS — E119 Type 2 diabetes mellitus without complications: Secondary | ICD-10-CM

## 2020-06-14 DIAGNOSIS — I10 Essential (primary) hypertension: Secondary | ICD-10-CM

## 2020-06-14 DIAGNOSIS — E7800 Pure hypercholesterolemia, unspecified: Secondary | ICD-10-CM | POA: Diagnosis not present

## 2020-06-14 NOTE — Progress Notes (Signed)
Date:  06/28/2020   ID:  LJ MIYAMOTO, DOB 01-16-1946, MRN [id number]  PCP:  Dr. Deland Pretty Cardiologist:  Rex Kras, DO, [facility] (established care 06/14/2020) Former Cardiology Providers: NA  REASON FOR CONSULT: Aortic Atherosclerosis.   REQUESTING PHYSICIAN:  Deland Pretty, MD [address] [place],  [place] [address]  Chief Complaint  Patient presents with  . Aortic Arthersclerosis    on CXR  . New Patient (Initial Visit)    Referred by Deland Pretty, MD    HPI  Logan Gonzales is a 74 y.o. male who presents to the office with a chief complaint of "evaluation for aortic atherosclerosis.." Patient's past medical history and cardiovascular risk factors include: Hypertension, non-insulin-dependent diabetes mellitus type 2, hyperlipidemia, advanced age, hx of bladder cancer,  aortic atherosclerosis, family history of CAD, advanced age.  He is referred to the office at the request of Deland Pretty, MD for evaluation of aortic atherosclerosis.  Patient states that he recently had a chest x-ray with his PCP which noted aortic atherosclerosis and given his multiple cardiovascular risk factors referred to cardiology for further evaluation and management.  Patient is concerned that he may have underlying multivessel CAD given his family history of premature coronary artery disease and his risk factors of diabetes, hyperlipidemia, and advanced age.  He denies active chest pain at rest or with effort related activities.  He is overall euvolemic and not in congestive heart failure clinically.  Patient continues to walk 1 mile every other day and has not noticed any significant change in overall physical endurance.  Family history of CAD with Dad having MI at age of 66 but he was a heavy smoker.  FUNCTIONAL STATUS: Walks 1 mile every other day.    ALLERGIES: No Known Allergies  MEDICATION LIST PRIOR TO VISIT: Current Meds  Medication Sig  . amLODipine (NORVASC) 5 MG  tablet Take 5 mg by mouth daily.  Marland Kitchen ascorbic acid (VITAMIN C) 250 MG tablet Take by mouth.  Marland Kitchen aspirin EC 81 MG tablet Take 81 mg by mouth daily. Swallow whole.  Marland Kitchen glipiZIDE (GLUCOTROL) 10 MG tablet Take 5 mg by mouth 2 (two) times daily before a meal.  . hydrochlorothiazide (HYDRODIURIL) 12.5 MG tablet Take 1 tablet by mouth 2 (two) times daily.  Marland Kitchen JARDIANCE 25 MG TABS tablet Take 25 mg by mouth daily.  Marland Kitchen losartan (COZAAR) 50 MG tablet Take 50 mg by mouth 2 (two) times daily.  . metoprolol succinate (TOPROL-XL) 50 MG 24 hr tablet Take 50 mg by mouth daily.  . rosuvastatin (CRESTOR) 10 MG tablet Take 10 mg by mouth daily.  . SitaGLIPtin-MetFORMIN HCl (JANUMET XR) 50-1000 MG TB24 Take 1 tablet by mouth 2 (two) times daily.     PAST MEDICAL HISTORY: Past Medical History:  Diagnosis Date  . Cancer ([place])   . Diabetes mellitus without complication ([place])   . Hyperlipidemia   . Hypertension     PAST SURGICAL HISTORY: Past Surgical History:  Procedure Laterality Date  . BLADDER SURGERY    . HERNIA REPAIR    . SHOULDER SURGERY Right     FAMILY HISTORY: The patient family history includes Cancer in his brother, sister, and sister; Emphysema in his mother; Heart disease in his father.  SOCIAL HISTORY:  The patient  reports that he has never smoked. He has never used smokeless tobacco. He reports current alcohol use. He reports that he does not use drugs.  REVIEW OF SYSTEMS: Review of Systems  Constitutional: Negative for chills and fever.  HENT: Negative for hoarse voice and nosebleeds.   Eyes: Negative for discharge, double vision and pain.  Cardiovascular: Negative for chest pain, claudication, dyspnea on exertion, leg swelling, near-syncope, orthopnea, palpitations, paroxysmal nocturnal dyspnea and syncope.  Respiratory: Negative for hemoptysis and shortness of breath.   Musculoskeletal: Negative for muscle cramps and myalgias.  Gastrointestinal: Negative for abdominal pain,  constipation, diarrhea, hematemesis, hematochezia, melena, nausea and vomiting.  Neurological: Negative for dizziness and light-headedness.    PHYSICAL EXAM: Vitals with BMI 06/14/2020 07/21/2012  Height 6' 1.5" 6' 1.5"  Weight 212 lbs 212 lbs 10 oz  BMI 79.15 05.6  Systolic 979 480  Diastolic 67 76  Pulse 68 71   CONSTITUTIONAL: Well-developed and well-nourished. No acute distress.  SKIN: Skin is warm and dry. No rash noted. No cyanosis. No pallor. No jaundice HEAD: Normocephalic and atraumatic.  EYES: No scleral icterus MOUTH/THROAT: Moist oral membranes.  NECK: No JVD present. No thyromegaly noted. No carotid bruits  LYMPHATIC: No visible cervical adenopathy.  CHEST Normal respiratory effort. No intercostal retractions  LUNGS: Clear to auscultation bilaterally.  No stridor. No wheezes. No rales.  CARDIOVASCULAR: Regular rate and rhythm, positive X6-P5, soft holosystolic murmur heard at the apex, no gallops or rubs. ABDOMINAL: No apparent ascites.  EXTREMITIES: No peripheral edema  HEMATOLOGIC: No significant bruising NEUROLOGIC: Oriented to person, place, and time. Nonfocal. Normal muscle tone.  PSYCHIATRIC: Normal mood and affect. Normal behavior. Cooperative  Radiology: Chest x-ray 06/01/2020: Aortic atherosclerosis.  No radiographic evidence of acute cardiopulmonary disease.  CARDIAC DATABASE: EKG: 06/14/2020: Normal sinus, 70 bpm, T wave inversions in leads V3, V4, and III, without underlying injury pattern.  Echocardiogram: No results found for this or any previous visit from the past 1095 days.   Stress Testing: No results found for this or any previous visit from the past 1095 days.  Heart Catheterization: None  LABORATORY DATA: CBC Latest Ref Rng & Units 10/14/2010 08/30/2010 08/14/2008  WBC 4.0 - 10.5 K/uL - - 4.3  Hemoglobin 13.0 - 17.0 g/dL 14.3 15.3 15.1  Hematocrit 39.0 - 52.0 % 42.0 45.0 44.6  Platelets 150 - 400 K/uL - - 133(L)    CMP Latest Ref Rng &  Units 10/14/2010 08/30/2010 08/14/2008  Glucose 70 - 99 mg/dL 114(H) 92 130(H)  BUN 6 - 23 mg/dL - - 20  Creatinine 0.4 - 1.5 mg/dL - - 0.95  Sodium 135 - 145 mEq/L 141 140 138  Potassium 3.5 - 5.1 mEq/L 3.9 3.8 4.6  Chloride 96 - 112 mEq/L - - 103  CO2 19 - 32 mEq/L - - 27  Calcium 8.4 - 10.5 mg/dL - - 9.4  Total Protein 6.0 - 8.3 g/dL - - 6.9  Total Bilirubin 0.3 - 1.2 mg/dL - - 0.8  Alkaline Phos 39 - 117 U/L - - 47  AST 0 - 37 U/L - - 22  ALT 0 - 53 U/L - - 22    Lipid Panel  No results found for: CHOL, TRIG, HDL, CHOLHDL, VLDL, LDLCALC, LDLDIRECT, LABVLDL  No components found for: NTPROBNP No results for input(s): PROBNP in the last 8760 hours. No results for input(s): TSH in the last 8760 hours.  BMP No results for input(s): NA, K, CL, CO2, GLUCOSE, BUN, CREATININE, CALCIUM, GFRNONAA, GFRAA in the last 8760 hours.  HEMOGLOBIN A1C No results found for: HGBA1C, MPG  External Labs: Collected: 03/23/2020 Hemoglobin: 15.9 g/dL Creatinine 0.89 mg/dL. eGFR: 84 mL/min per 1.73 m  AST 15, ALT 13 Lipid profile: Total cholesterol 114, triglycerides 72, HDL 43, non-HDL 58 Hemoglobin A1c: 7.0  IMPRESSION:    ICD-10-CM   1. Atherosclerosis of aorta (HCC)  I70.0 PCV CARDIAC STRESS TEST    CT CARDIAC SCORING  2. Essential hypertension, benign  I10 EKG 12-Lead  3. Non-insulin dependent type 2 diabetes mellitus (HCC)  E11.9 PCV ECHOCARDIOGRAM COMPLETE    CT CARDIAC SCORING  4. Hypercholesteremia  E78.00   5. Family history of premature coronary artery disease  Z82.49 PCV ECHOCARDIOGRAM COMPLETE    CT CARDIAC SCORING  6. Nonspecific abnormal electrocardiogram (ECG) (EKG)  R94.31 PCV CARDIAC STRESS TEST     RECOMMENDATIONS: Logan Gonzales is a 74 y.o. male whose past medical history and cardiac risk factors include: Hypertension, non-insulin-dependent diabetes mellitus type 2, hyperlipidemia, advanced age, hx of bladder cancer,  aortic atherosclerosis, family history of CAD,  advanced age.  Given the evidence of aortic atherosclerosis on a chest x-ray I am concerned that he may also have atherosclerosis in various vascular beds and given his multiple cardiovascular risk factors as noted above his overall pretest probability of having underlying CAD is intermediate to high.  However, since he is asymptomatic would recommend coronary artery calcification score for further risk stratification.  He is already on aspirin and statin therapy.  Despite being asymptomatic, he does have a EKG changes and therefore would recommend an ischemic evaluation.  Plan exercise treadmill stress test to evaluate for overall functional status and exercise-induced ischemia. Echocardiogram will be ordered to evaluate for structural heart disease and left ventricular systolic function.  Further recommendations to follow.  As a part of this consultation reviewed outside records provided by primary team, labs that were performed in outside facility and summarized above, medications reconciled, had a detailed discussion with the patient in regards to disease management and coordination of care.  Total time spent: 45 minutes.  FINAL MEDICATION LIST END OF ENCOUNTER: No orders of the defined types were placed in this encounter.   Current Outpatient Medications:  .  amLODipine (NORVASC) 5 MG tablet, Take 5 mg by mouth daily., Disp: , Rfl:  .  ascorbic acid (VITAMIN C) 250 MG tablet, Take by mouth., Disp: , Rfl:  .  aspirin EC 81 MG tablet, Take 81 mg by mouth daily. Swallow whole., Disp: , Rfl:  .  glipiZIDE (GLUCOTROL) 10 MG tablet, Take 5 mg by mouth 2 (two) times daily before a meal., Disp: , Rfl:  .  hydrochlorothiazide (HYDRODIURIL) 12.5 MG tablet, Take 1 tablet by mouth 2 (two) times daily., Disp: , Rfl:  .  JARDIANCE 25 MG TABS tablet, Take 25 mg by mouth daily., Disp: , Rfl:  .  losartan (COZAAR) 50 MG tablet, Take 50 mg by mouth 2 (two) times daily., Disp: , Rfl:  .  metoprolol  succinate (TOPROL-XL) 50 MG 24 hr tablet, Take 50 mg by mouth daily., Disp: , Rfl:  .  rosuvastatin (CRESTOR) 10 MG tablet, Take 10 mg by mouth daily., Disp: , Rfl:  .  SitaGLIPtin-MetFORMIN HCl (JANUMET XR) 50-1000 MG TB24, Take 1 tablet by mouth 2 (two) times daily., Disp: , Rfl:  .  Saxagliptin-Metformin (KOMBIGLYZE XR) 2.11-998 MG TB24, Take by mouth., Disp: , Rfl:   Orders Placed This Encounter  Procedures  . CT CARDIAC SCORING  . PCV CARDIAC STRESS TEST  . EKG 12-Lead  . PCV ECHOCARDIOGRAM COMPLETE    There are no Patient Instructions on file for this visit.   --  Continue cardiac medications as reconciled in final medication list. --Return in about 4 weeks (around 07/12/2020) for follow up aortic atherosclerosis and review test results. . Or sooner if needed. --Continue follow-up with your primary care physician regarding the management of your other chronic comorbid conditions.  Patient's questions and concerns were addressed to his satisfaction. He voices understanding of the instructions provided during this encounter.   This note was created using a voice recognition software as a result there may be grammatical errors inadvertently enclosed that do not reflect the nature of this encounter. Every attempt is made to correct such errors.  Rex Kras, [place], [facility]  Pager: [phone] Office: [phone]

## 2020-06-21 NOTE — Progress Notes (Signed)
Spoke to patient about his score pt states you can call whenever and [phone] is the best number to reach him

## 2020-06-22 ENCOUNTER — Other Ambulatory Visit: Payer: Self-pay

## 2020-06-22 ENCOUNTER — Ambulatory Visit: Payer: PPO

## 2020-06-22 DIAGNOSIS — E119 Type 2 diabetes mellitus without complications: Secondary | ICD-10-CM | POA: Diagnosis not present

## 2020-06-22 DIAGNOSIS — Z8249 Family history of ischemic heart disease and other diseases of the circulatory system: Secondary | ICD-10-CM | POA: Diagnosis not present

## 2020-06-25 NOTE — Progress Notes (Signed)
Spoke to pt

## 2020-06-28 ENCOUNTER — Encounter: Payer: Self-pay | Admitting: Cardiology

## 2020-06-28 ENCOUNTER — Other Ambulatory Visit: Payer: Self-pay

## 2020-06-28 ENCOUNTER — Ambulatory Visit: Payer: PPO | Admitting: Cardiology

## 2020-06-28 VITALS — BP 119/59 | HR 58 | Ht 73.5 in | Wt 187.0 lb

## 2020-06-28 DIAGNOSIS — I2510 Atherosclerotic heart disease of native coronary artery without angina pectoris: Secondary | ICD-10-CM | POA: Diagnosis not present

## 2020-06-28 DIAGNOSIS — R0609 Other forms of dyspnea: Secondary | ICD-10-CM | POA: Diagnosis not present

## 2020-06-28 DIAGNOSIS — Z8249 Family history of ischemic heart disease and other diseases of the circulatory system: Secondary | ICD-10-CM | POA: Diagnosis not present

## 2020-06-28 DIAGNOSIS — I2584 Coronary atherosclerosis due to calcified coronary lesion: Secondary | ICD-10-CM | POA: Diagnosis not present

## 2020-06-28 DIAGNOSIS — R0600 Dyspnea, unspecified: Secondary | ICD-10-CM | POA: Diagnosis not present

## 2020-06-28 DIAGNOSIS — Z712 Person consulting for explanation of examination or test findings: Secondary | ICD-10-CM

## 2020-06-28 DIAGNOSIS — I10 Essential (primary) hypertension: Secondary | ICD-10-CM

## 2020-06-28 DIAGNOSIS — E7800 Pure hypercholesterolemia, unspecified: Secondary | ICD-10-CM

## 2020-06-28 DIAGNOSIS — E119 Type 2 diabetes mellitus without complications: Secondary | ICD-10-CM | POA: Diagnosis not present

## 2020-06-28 NOTE — Progress Notes (Signed)
Date:  06/28/2020   ID:  Logan Gonzales, DOB 07-25-1945, MRN [id number]  PCP:  Dr. Deland Pretty Cardiologist:  Rex Kras, DO, [facility] (established care 06/14/2020) Former Cardiology Providers: NA  Date: 06/28/20 Last Office Visit: 06/14/2020  Chief Complaint  Patient presents with  . Atherosclerosis of aorta   . Shortness of Breath  . Results    HPI  Logan Gonzales is a 74 y.o. male who presents to the office with a chief complaint of "evaluation for aortic atherosclerosis.." Patient's past medical history and cardiovascular risk factors include: Severe coronary artery calcification, Hypertension, non-insulin-dependent diabetes mellitus type 2, hyperlipidemia, advanced age, hx of bladder cancer,  aortic atherosclerosis, family history of CAD, advanced age.  He is referred to the office at the request of Dr. Shelia Media for evaluation of aortic atherosclerosis.  Patient is accompanied by his wife [name]) at today's visit.  Patient states that he recently had a chest x-ray with his PCP which noted aortic atherosclerosis and given his multiple cardiovascular risk factors referred to cardiology for further evaluation and management.  Given his family history of premature coronary artery disease and his risk factors of diabetes, hyperlipidemia, and advanced age he was recommended to undergo calcium scoring an echocardiogram.  Since last office visit he has noticed that when he goes out for walks he does have dyspnea on exertion and decreased physical endurance. He does take atleast one break during this walking "to catch is breath."   Coronary artery calcification score is 4356 consistent with severe calcification which places him at the 90-100th percentile for age matched cohorts.  His echocardiogram notes preserved LVEF without any significant valvular disease.  FUNCTIONAL STATUS: Walks 1 mile every other day.    ALLERGIES: No Known Allergies  MEDICATION LIST PRIOR TO VISIT: Current Meds   Medication Sig  . amLODipine (NORVASC) 5 MG tablet Take 5 mg by mouth daily.  Marland Kitchen ascorbic acid (VITAMIN C) 250 MG tablet Take by mouth.  Marland Kitchen aspirin EC 81 MG tablet Take 81 mg by mouth daily. Swallow whole.  Marland Kitchen glipiZIDE (GLUCOTROL) 10 MG tablet Take 5 mg by mouth 2 (two) times daily before a meal.  . hydrochlorothiazide (HYDRODIURIL) 12.5 MG tablet Take 1 tablet by mouth 2 (two) times daily.  Marland Kitchen JARDIANCE 25 MG TABS tablet Take 25 mg by mouth daily.  Marland Kitchen losartan (COZAAR) 50 MG tablet Take 50 mg by mouth 2 (two) times daily.  . metoprolol succinate (TOPROL-XL) 50 MG 24 hr tablet Take 50 mg by mouth daily.  . rosuvastatin (CRESTOR) 10 MG tablet Take 10 mg by mouth daily.  . Saxagliptin-Metformin 2.11-998 MG TB24 Take by mouth.  . SitaGLIPtin-MetFORMIN HCl (JANUMET XR) 50-1000 MG TB24 Take 1 tablet by mouth 2 (two) times daily.     PAST MEDICAL HISTORY: Past Medical History:  Diagnosis Date  . Cancer ([place])   . Coronary artery disease due to calcified coronary lesion   . Diabetes mellitus without complication ([place])   . Hyperlipidemia   . Hypertension     PAST SURGICAL HISTORY: Past Surgical History:  Procedure Laterality Date  . BLADDER SURGERY    . HERNIA REPAIR    . SHOULDER SURGERY Right     FAMILY HISTORY: The patient family history includes Cancer in his brother, sister, and sister; Emphysema in his mother; Heart disease in his father.  SOCIAL HISTORY:  The patient  reports that he has never smoked. He has never used smokeless tobacco. He reports current alcohol use.  He reports that he does not use drugs.  REVIEW OF SYSTEMS: Review of Systems  Constitutional: Negative for chills and fever.  HENT: Negative for hoarse voice and nosebleeds.   Eyes: Negative for discharge, double vision and pain.  Cardiovascular: Negative for chest pain, claudication, dyspnea on exertion, leg swelling, near-syncope, orthopnea, palpitations, paroxysmal nocturnal dyspnea and syncope.  Respiratory:  Negative for hemoptysis and shortness of breath.   Musculoskeletal: Negative for muscle cramps and myalgias.  Gastrointestinal: Negative for abdominal pain, constipation, diarrhea, hematemesis, hematochezia, melena, nausea and vomiting.  Neurological: Negative for dizziness and light-headedness.    PHYSICAL EXAM: Vitals with BMI 06/28/2020 06/14/2020 07/21/2012  Height 6' 1.5" 6' 1.5" 6' 1.5"  Weight 187 lbs 212 lbs 212 lbs 10 oz  BMI 24.33 01.60 10.9  Systolic 323 557 322  Diastolic 59 67 76  Pulse 58 68 71   CONSTITUTIONAL: Well-developed and well-nourished. No acute distress.  SKIN: Skin is warm and dry. No rash noted. No cyanosis. No pallor. No jaundice HEAD: Normocephalic and atraumatic.  EYES: No scleral icterus MOUTH/THROAT: Moist oral membranes.  NECK: No JVD present. No thyromegaly noted. No carotid bruits  LYMPHATIC: No visible cervical adenopathy.  CHEST Normal respiratory effort. No intercostal retractions  LUNGS: Clear to auscultation bilaterally.  No stridor. No wheezes. No rales.  CARDIOVASCULAR: Regular rate and rhythm, positive G2-R4, soft holosystolic murmur heard at the apex, no gallops or rubs. ABDOMINAL: No apparent ascites.  EXTREMITIES: No peripheral edema  HEMATOLOGIC: No significant bruising NEUROLOGIC: Oriented to person, place, and time. Nonfocal. Normal muscle tone.  PSYCHIATRIC: Normal mood and affect. Normal behavior. Cooperative  Radiology: Chest x-ray 06/01/2020: Aortic atherosclerosis.  No radiographic evidence of acute cardiopulmonary disease.  CARDIAC DATABASE: EKG: 06/14/2020: Normal sinus, 70 bpm, T wave inversions in leads V3, V4, and III, without underlying injury pattern.  Echocardiogram: 06/22/2020: Left ventricle cavity is normal in size. Mild concentric hypertrophy of the left ventricle. Normal global wall motion. Normal LV systolic function with EF 63%. Normal diastolic filling pattern.  Left atrial cavity is mildly dilated. Mild  (Grade I) mitral regurgitation. Mild tricuspid regurgitation. No evidence of pulmonary hypertension.   Stress Testing: No results found for this or any previous visit from the past 1095 days.  Coronary artery calcification scoring performed on 06/17/2020 at [facility]: Left main: 74 Left anterior descending: 1676 Left circumflex: 576 Right coronary artery: 2275 Posterior descending artery: 55 Total calcium score: 4656 AU, this is between 90-100th percentile for men patient this age.  Heart Catheterization: None  LABORATORY DATA: CBC Latest Ref Rng & Units 10/14/2010 08/30/2010 08/14/2008  WBC 4.0 - 10.5 K/uL - - 4.3  Hemoglobin 13.0 - 17.0 g/dL 14.3 15.3 15.1  Hematocrit 39.0 - 52.0 % 42.0 45.0 44.6  Platelets 150 - 400 K/uL - - 133(L)    CMP Latest Ref Rng & Units 10/14/2010 08/30/2010 08/14/2008  Glucose 70 - 99 mg/dL 114(H) 92 130(H)  BUN 6 - 23 mg/dL - - 20  Creatinine 0.4 - 1.5 mg/dL - - 0.95  Sodium 135 - 145 mEq/L 141 140 138  Potassium 3.5 - 5.1 mEq/L 3.9 3.8 4.6  Chloride 96 - 112 mEq/L - - 103  CO2 19 - 32 mEq/L - - 27  Calcium 8.4 - 10.5 mg/dL - - 9.4  Total Protein 6.0 - 8.3 g/dL - - 6.9  Total Bilirubin 0.3 - 1.2 mg/dL - - 0.8  Alkaline Phos 39 - 117 U/L - - 47  AST 0 -  37 U/L - - 22  ALT 0 - 53 U/L - - 22    Lipid Panel  No results found for: CHOL, TRIG, HDL, CHOLHDL, VLDL, LDLCALC, LDLDIRECT, LABVLDL  No components found for: NTPROBNP No results for input(s): PROBNP in the last 8760 hours. No results for input(s): TSH in the last 8760 hours.  BMP No results for input(s): NA, K, CL, CO2, GLUCOSE, BUN, CREATININE, CALCIUM, GFRNONAA, GFRAA in the last 8760 hours.  HEMOGLOBIN A1C No results found for: HGBA1C, MPG  External Labs: Collected: 03/23/2020 Hemoglobin: 15.9 g/dL Creatinine 0.89 mg/dL. eGFR: 84 mL/min per 1.73 m AST 15, ALT 13 Lipid profile: Total cholesterol 114, triglycerides 72, HDL 43, non-HDL 58 Hemoglobin A1c: 7.0  IMPRESSION:     ICD-10-CM   1. Dyspnea on exertion  N47.09 Basic metabolic panel    CBC    SARS-COV-2 RNA,(COVID-19) QUAL NAAT  2. Coronary atherosclerosis due to calcified coronary lesion  G28.36 Basic metabolic panel   O29.47 CBC  3. Essential hypertension, benign  I10   4. Non-insulin dependent type 2 diabetes mellitus ([place])  E11.9   5. Hypercholesteremia  E78.00   6. Family history of premature coronary artery disease  Z82.49   7. Encounter to discuss test results  Z71.2      RECOMMENDATIONS: Logan Gonzales is a 74 y.o. male whose past medical history and cardiac risk factors include: Hypertension, non-insulin-dependent diabetes mellitus type 2, hyperlipidemia, advanced age, hx of bladder cancer,  aortic atherosclerosis, family history of CAD, advanced age.  Dyspnea on exertion with severe coronary artery calcification:  Since last office visit patient has noticed dyspnea on exertion & decreased physical endurance.  In addition his cardiac work-up thus far notes severe coronary artery calcification along with multiple cardiovascular risk factors including diabetes and family history of premature CAD.  Patient does not have any active chest pain or symptoms suggestive of anginal equivalents.  We discussed undergoing ischemic evaluation with either nuclear stress test or left heart catheterization.  Given his severe coronary artery calcification coronary CTA is not ideal test of choice.  This shared decision between the patient and his wife was to proceed with left heart catheterization after discussing the risks, benefits and alternatives of the procedure.  The procedure of left heart catheterization with possible intervention was explained to the patient in detail. The indication, alternatives, risks and benefits were reviewed. Complications include but not limited to bleeding, infection, vascular injury, stroke, myocardial infection, arrhythmia, kidney injury, radiation-related injury in the case of  prolonged fluoroscopy use, emergency cardiac surgery, and death. The patient understands the risks of serious complication is 1-2 in 6546 with diagnostic cardiac cath and 1-2% or less with angioplasty/stenting. I have discussed in particular detail the possibility of acute renal failure after coronary angiography particularly if PCI is necessary. The patient and his wife voices understanding and provides verbal feedback and wishes to proceed with coronary angiography with possible PCI.  Check CBC and BMP  Screen for COVID-19 prior to elective procedure.  Continue aspirin and statin therapy.  Continue beta-blockers, losartan, Jardiance, and sitagliptin.  Educated on importance of risk factor modification including blood pressure management, glycemic control, lipid management.  Further recommendations follow-up after left heart catheterization.  Non-insulin-dependent diabetes mellitus type 2: Educated on importance of glycemic control.  Benign essential hypertension: Continue current medical therapy.  Office blood pressure is currently at goal.  Low-salt diet recommended.  FINAL MEDICATION LIST END OF ENCOUNTER: No orders of the defined types were  placed in this encounter.   Current Outpatient Medications:  .  amLODipine (NORVASC) 5 MG tablet, Take 5 mg by mouth daily., Disp: , Rfl:  .  ascorbic acid (VITAMIN C) 250 MG tablet, Take by mouth., Disp: , Rfl:  .  aspirin EC 81 MG tablet, Take 81 mg by mouth daily. Swallow whole., Disp: , Rfl:  .  glipiZIDE (GLUCOTROL) 10 MG tablet, Take 5 mg by mouth 2 (two) times daily before a meal., Disp: , Rfl:  .  hydrochlorothiazide (HYDRODIURIL) 12.5 MG tablet, Take 1 tablet by mouth 2 (two) times daily., Disp: , Rfl:  .  JARDIANCE 25 MG TABS tablet, Take 25 mg by mouth daily., Disp: , Rfl:  .  losartan (COZAAR) 50 MG tablet, Take 50 mg by mouth 2 (two) times daily., Disp: , Rfl:  .  metoprolol succinate (TOPROL-XL) 50 MG 24 hr tablet, Take 50 mg by  mouth daily., Disp: , Rfl:  .  rosuvastatin (CRESTOR) 10 MG tablet, Take 10 mg by mouth daily., Disp: , Rfl:  .  Saxagliptin-Metformin 2.11-998 MG TB24, Take by mouth., Disp: , Rfl:  .  SitaGLIPtin-MetFORMIN HCl (JANUMET XR) 50-1000 MG TB24, Take 1 tablet by mouth 2 (two) times daily., Disp: , Rfl:   Orders Placed This Encounter  Procedures  . SARS-COV-2 RNA,(COVID-19) QUAL NAAT  . Basic metabolic panel  . CBC    There are no Patient Instructions on file for this visit.   --Continue cardiac medications as reconciled in final medication list. --Return in about 2 weeks (around 07/12/2020) for Post heart catheterization. Or sooner if needed. --Continue follow-up with your primary care physician regarding the management of your other chronic comorbid conditions.  Patient's questions and concerns were addressed to his satisfaction. He voices understanding of the instructions provided during this encounter.   This note was created using a voice recognition software as a result there may be grammatical errors inadvertently enclosed that do not reflect the nature of this encounter. Every attempt is made to correct such errors.  Rex Kras, [place], [facility]  Pager: [phone] Office: [phone]

## 2020-06-28 NOTE — H&P (View-Only) (Signed)
Date:  06/28/2020   ID:  Logan Gonzales, DOB 07-25-1945, MRN [id number]  PCP:  Dr. Deland Pretty Cardiologist:  Rex Kras, DO, [facility] (established care 06/14/2020) Former Cardiology Providers: NA  Date: 06/28/20 Last Office Visit: 06/14/2020  Chief Complaint  Patient presents with  . Atherosclerosis of aorta   . Shortness of Breath  . Results    HPI  Logan Gonzales is a 74 y.o. male who presents to the office with a chief complaint of "evaluation for aortic atherosclerosis.." Patient's past medical history and cardiovascular risk factors include: Severe coronary artery calcification, Hypertension, non-insulin-dependent diabetes mellitus type 2, hyperlipidemia, advanced age, hx of bladder cancer,  aortic atherosclerosis, family history of CAD, advanced age.  He is referred to the office at the request of Dr. Shelia Media for evaluation of aortic atherosclerosis.  Patient is accompanied by his wife [name]) at today's visit.  Patient states that he recently had a chest x-ray with his PCP which noted aortic atherosclerosis and given his multiple cardiovascular risk factors referred to cardiology for further evaluation and management.  Given his family history of premature coronary artery disease and his risk factors of diabetes, hyperlipidemia, and advanced age he was recommended to undergo calcium scoring an echocardiogram.  Since last office visit he has noticed that when he goes out for walks he does have dyspnea on exertion and decreased physical endurance. He does take atleast one break during this walking "to catch is breath."   Coronary artery calcification score is 4356 consistent with severe calcification which places him at the 90-100th percentile for age matched cohorts.  His echocardiogram notes preserved LVEF without any significant valvular disease.  FUNCTIONAL STATUS: Walks 1 mile every other day.    ALLERGIES: No Known Allergies  MEDICATION LIST PRIOR TO VISIT: Current Meds   Medication Sig  . amLODipine (NORVASC) 5 MG tablet Take 5 mg by mouth daily.  Marland Kitchen ascorbic acid (VITAMIN C) 250 MG tablet Take by mouth.  Marland Kitchen aspirin EC 81 MG tablet Take 81 mg by mouth daily. Swallow whole.  Marland Kitchen glipiZIDE (GLUCOTROL) 10 MG tablet Take 5 mg by mouth 2 (two) times daily before a meal.  . hydrochlorothiazide (HYDRODIURIL) 12.5 MG tablet Take 1 tablet by mouth 2 (two) times daily.  Marland Kitchen JARDIANCE 25 MG TABS tablet Take 25 mg by mouth daily.  Marland Kitchen losartan (COZAAR) 50 MG tablet Take 50 mg by mouth 2 (two) times daily.  . metoprolol succinate (TOPROL-XL) 50 MG 24 hr tablet Take 50 mg by mouth daily.  . rosuvastatin (CRESTOR) 10 MG tablet Take 10 mg by mouth daily.  . Saxagliptin-Metformin 2.11-998 MG TB24 Take by mouth.  . SitaGLIPtin-MetFORMIN HCl (JANUMET XR) 50-1000 MG TB24 Take 1 tablet by mouth 2 (two) times daily.     PAST MEDICAL HISTORY: Past Medical History:  Diagnosis Date  . Cancer ([place])   . Coronary artery disease due to calcified coronary lesion   . Diabetes mellitus without complication ([place])   . Hyperlipidemia   . Hypertension     PAST SURGICAL HISTORY: Past Surgical History:  Procedure Laterality Date  . BLADDER SURGERY    . HERNIA REPAIR    . SHOULDER SURGERY Right     FAMILY HISTORY: The patient family history includes Cancer in his brother, sister, and sister; Emphysema in his mother; Heart disease in his father.  SOCIAL HISTORY:  The patient  reports that he has never smoked. He has never used smokeless tobacco. He reports current alcohol use.  He reports that he does not use drugs.  REVIEW OF SYSTEMS: Review of Systems  Constitutional: Negative for chills and fever.  HENT: Negative for hoarse voice and nosebleeds.   Eyes: Negative for discharge, double vision and pain.  Cardiovascular: Negative for chest pain, claudication, dyspnea on exertion, leg swelling, near-syncope, orthopnea, palpitations, paroxysmal nocturnal dyspnea and syncope.  Respiratory:  Negative for hemoptysis and shortness of breath.   Musculoskeletal: Negative for muscle cramps and myalgias.  Gastrointestinal: Negative for abdominal pain, constipation, diarrhea, hematemesis, hematochezia, melena, nausea and vomiting.  Neurological: Negative for dizziness and light-headedness.    PHYSICAL EXAM: Vitals with BMI 06/28/2020 06/14/2020 07/21/2012  Height 6' 1.5" 6' 1.5" 6' 1.5"  Weight 187 lbs 212 lbs 212 lbs 10 oz  BMI 24.33 01.60 10.9  Systolic 323 557 322  Diastolic 59 67 76  Pulse 58 68 71   CONSTITUTIONAL: Well-developed and well-nourished. No acute distress.  SKIN: Skin is warm and dry. No rash noted. No cyanosis. No pallor. No jaundice HEAD: Normocephalic and atraumatic.  EYES: No scleral icterus MOUTH/THROAT: Moist oral membranes.  NECK: No JVD present. No thyromegaly noted. No carotid bruits  LYMPHATIC: No visible cervical adenopathy.  CHEST Normal respiratory effort. No intercostal retractions  LUNGS: Clear to auscultation bilaterally.  No stridor. No wheezes. No rales.  CARDIOVASCULAR: Regular rate and rhythm, positive G2-R4, soft holosystolic murmur heard at the apex, no gallops or rubs. ABDOMINAL: No apparent ascites.  EXTREMITIES: No peripheral edema  HEMATOLOGIC: No significant bruising NEUROLOGIC: Oriented to person, place, and time. Nonfocal. Normal muscle tone.  PSYCHIATRIC: Normal mood and affect. Normal behavior. Cooperative  Radiology: Chest x-ray 06/01/2020: Aortic atherosclerosis.  No radiographic evidence of acute cardiopulmonary disease.  CARDIAC DATABASE: EKG: 06/14/2020: Normal sinus, 70 bpm, T wave inversions in leads V3, V4, and III, without underlying injury pattern.  Echocardiogram: 06/22/2020: Left ventricle cavity is normal in size. Mild concentric hypertrophy of the left ventricle. Normal global wall motion. Normal LV systolic function with EF 63%. Normal diastolic filling pattern.  Left atrial cavity is mildly dilated. Mild  (Grade I) mitral regurgitation. Mild tricuspid regurgitation. No evidence of pulmonary hypertension.   Stress Testing: No results found for this or any previous visit from the past 1095 days.  Coronary artery calcification scoring performed on 06/17/2020 at [facility]: Left main: 74 Left anterior descending: 1676 Left circumflex: 576 Right coronary artery: 2275 Posterior descending artery: 55 Total calcium score: 4656 AU, this is between 90-100th percentile for men patient this age.  Heart Catheterization: None  LABORATORY DATA: CBC Latest Ref Rng & Units 10/14/2010 08/30/2010 08/14/2008  WBC 4.0 - 10.5 K/uL - - 4.3  Hemoglobin 13.0 - 17.0 g/dL 14.3 15.3 15.1  Hematocrit 39.0 - 52.0 % 42.0 45.0 44.6  Platelets 150 - 400 K/uL - - 133(L)    CMP Latest Ref Rng & Units 10/14/2010 08/30/2010 08/14/2008  Glucose 70 - 99 mg/dL 114(H) 92 130(H)  BUN 6 - 23 mg/dL - - 20  Creatinine 0.4 - 1.5 mg/dL - - 0.95  Sodium 135 - 145 mEq/L 141 140 138  Potassium 3.5 - 5.1 mEq/L 3.9 3.8 4.6  Chloride 96 - 112 mEq/L - - 103  CO2 19 - 32 mEq/L - - 27  Calcium 8.4 - 10.5 mg/dL - - 9.4  Total Protein 6.0 - 8.3 g/dL - - 6.9  Total Bilirubin 0.3 - 1.2 mg/dL - - 0.8  Alkaline Phos 39 - 117 U/L - - 47  AST 0 -  37 U/L - - 22  ALT 0 - 53 U/L - - 22    Lipid Panel  No results found for: CHOL, TRIG, HDL, CHOLHDL, VLDL, LDLCALC, LDLDIRECT, LABVLDL  No components found for: NTPROBNP No results for input(s): PROBNP in the last 8760 hours. No results for input(s): TSH in the last 8760 hours.  BMP No results for input(s): NA, K, CL, CO2, GLUCOSE, BUN, CREATININE, CALCIUM, GFRNONAA, GFRAA in the last 8760 hours.  HEMOGLOBIN A1C No results found for: HGBA1C, MPG  External Labs: Collected: 03/23/2020 Hemoglobin: 15.9 g/dL Creatinine 0.89 mg/dL. eGFR: 84 mL/min per 1.73 m AST 15, ALT 13 Lipid profile: Total cholesterol 114, triglycerides 72, HDL 43, non-HDL 58 Hemoglobin A1c: 7.0  IMPRESSION:     ICD-10-CM   1. Dyspnea on exertion  N47.09 Basic metabolic panel    CBC    SARS-COV-2 RNA,(COVID-19) QUAL NAAT  2. Coronary atherosclerosis due to calcified coronary lesion  G28.36 Basic metabolic panel   O29.47 CBC  3. Essential hypertension, benign  I10   4. Non-insulin dependent type 2 diabetes mellitus ([place])  E11.9   5. Hypercholesteremia  E78.00   6. Family history of premature coronary artery disease  Z82.49   7. Encounter to discuss test results  Z71.2      RECOMMENDATIONS: ALLISON SILVA is a 74 y.o. male whose past medical history and cardiac risk factors include: Hypertension, non-insulin-dependent diabetes mellitus type 2, hyperlipidemia, advanced age, hx of bladder cancer,  aortic atherosclerosis, family history of CAD, advanced age.  Dyspnea on exertion with severe coronary artery calcification:  Since last office visit patient has noticed dyspnea on exertion & decreased physical endurance.  In addition his cardiac work-up thus far notes severe coronary artery calcification along with multiple cardiovascular risk factors including diabetes and family history of premature CAD.  Patient does not have any active chest pain or symptoms suggestive of anginal equivalents.  We discussed undergoing ischemic evaluation with either nuclear stress test or left heart catheterization.  Given his severe coronary artery calcification coronary CTA is not ideal test of choice.  This shared decision between the patient and his wife was to proceed with left heart catheterization after discussing the risks, benefits and alternatives of the procedure.  The procedure of left heart catheterization with possible intervention was explained to the patient in detail. The indication, alternatives, risks and benefits were reviewed. Complications include but not limited to bleeding, infection, vascular injury, stroke, myocardial infection, arrhythmia, kidney injury, radiation-related injury in the case of  prolonged fluoroscopy use, emergency cardiac surgery, and death. The patient understands the risks of serious complication is 1-2 in 6546 with diagnostic cardiac cath and 1-2% or less with angioplasty/stenting. I have discussed in particular detail the possibility of acute renal failure after coronary angiography particularly if PCI is necessary. The patient and his wife voices understanding and provides verbal feedback and wishes to proceed with coronary angiography with possible PCI.  Check CBC and BMP  Screen for COVID-19 prior to elective procedure.  Continue aspirin and statin therapy.  Continue beta-blockers, losartan, Jardiance, and sitagliptin.  Educated on importance of risk factor modification including blood pressure management, glycemic control, lipid management.  Further recommendations follow-up after left heart catheterization.  Non-insulin-dependent diabetes mellitus type 2: Educated on importance of glycemic control.  Benign essential hypertension: Continue current medical therapy.  Office blood pressure is currently at goal.  Low-salt diet recommended.  FINAL MEDICATION LIST END OF ENCOUNTER: No orders of the defined types were  placed in this encounter.   Current Outpatient Medications:  .  amLODipine (NORVASC) 5 MG tablet, Take 5 mg by mouth daily., Disp: , Rfl:  .  ascorbic acid (VITAMIN C) 250 MG tablet, Take by mouth., Disp: , Rfl:  .  aspirin EC 81 MG tablet, Take 81 mg by mouth daily. Swallow whole., Disp: , Rfl:  .  glipiZIDE (GLUCOTROL) 10 MG tablet, Take 5 mg by mouth 2 (two) times daily before a meal., Disp: , Rfl:  .  hydrochlorothiazide (HYDRODIURIL) 12.5 MG tablet, Take 1 tablet by mouth 2 (two) times daily., Disp: , Rfl:  .  JARDIANCE 25 MG TABS tablet, Take 25 mg by mouth daily., Disp: , Rfl:  .  losartan (COZAAR) 50 MG tablet, Take 50 mg by mouth 2 (two) times daily., Disp: , Rfl:  .  metoprolol succinate (TOPROL-XL) 50 MG 24 hr tablet, Take 50 mg by  mouth daily., Disp: , Rfl:  .  rosuvastatin (CRESTOR) 10 MG tablet, Take 10 mg by mouth daily., Disp: , Rfl:  .  Saxagliptin-Metformin 2.11-998 MG TB24, Take by mouth., Disp: , Rfl:  .  SitaGLIPtin-MetFORMIN HCl (JANUMET XR) 50-1000 MG TB24, Take 1 tablet by mouth 2 (two) times daily., Disp: , Rfl:   Orders Placed This Encounter  Procedures  . SARS-COV-2 RNA,(COVID-19) QUAL NAAT  . Basic metabolic panel  . CBC    There are no Patient Instructions on file for this visit.   --Continue cardiac medications as reconciled in final medication list. --Return in about 2 weeks (around 07/12/2020) for Post heart catheterization. Or sooner if needed. --Continue follow-up with your primary care physician regarding the management of your other chronic comorbid conditions.  Patient's questions and concerns were addressed to his satisfaction. He voices understanding of the instructions provided during this encounter.   This note was created using a voice recognition software as a result there may be grammatical errors inadvertently enclosed that do not reflect the nature of this encounter. Every attempt is made to correct such errors.  Rex Kras, [place], [facility]  Pager: [phone] Office: [phone]

## 2020-06-29 DIAGNOSIS — E7800 Pure hypercholesterolemia, unspecified: Secondary | ICD-10-CM | POA: Diagnosis not present

## 2020-06-29 DIAGNOSIS — I10 Essential (primary) hypertension: Secondary | ICD-10-CM | POA: Diagnosis not present

## 2020-06-29 DIAGNOSIS — E118 Type 2 diabetes mellitus with unspecified complications: Secondary | ICD-10-CM | POA: Diagnosis not present

## 2020-07-02 ENCOUNTER — Other Ambulatory Visit (HOSPITAL_COMMUNITY)
Admission: RE | Admit: 2020-07-02 | Discharge: 2020-07-02 | Disposition: A | Discharge status: Home / Self Care | Payer: PPO | Source: Ambulatory Visit | Attending: Cardiology | Admitting: Cardiology

## 2020-07-02 DIAGNOSIS — Z20822 Contact with and (suspected) exposure to covid-19: Secondary | ICD-10-CM | POA: Insufficient documentation

## 2020-07-02 DIAGNOSIS — Z01812 Encounter for preprocedural laboratory examination: Secondary | ICD-10-CM | POA: Insufficient documentation

## 2020-07-02 LAB — SARS CORONAVIRUS 2 (TAT 6-24 HRS): SARS Coronavirus 2: NEGATIVE

## 2020-07-03 LAB — SARS-COV-2 RNA,(COVID-19) QUALITATIVE NAAT: SARS CoV2 RNA: NOT DETECTED

## 2020-07-05 DIAGNOSIS — R0609 Other forms of dyspnea: Secondary | ICD-10-CM | POA: Diagnosis present

## 2020-07-05 DIAGNOSIS — I2510 Atherosclerotic heart disease of native coronary artery without angina pectoris: Secondary | ICD-10-CM | POA: Diagnosis present

## 2020-07-06 ENCOUNTER — Encounter (HOSPITAL_COMMUNITY): Payer: Self-pay | Admitting: Cardiology

## 2020-07-06 ENCOUNTER — Ambulatory Visit (HOSPITAL_COMMUNITY)
Admission: RE | Admit: 2020-07-06 | Discharge: 2020-07-06 | Disposition: A | Discharge status: Home / Self Care | Payer: PPO | Attending: Cardiology | Admitting: Cardiology

## 2020-07-06 ENCOUNTER — Other Ambulatory Visit: Payer: Self-pay

## 2020-07-06 ENCOUNTER — Ambulatory Visit (HOSPITAL_COMMUNITY)
Admission: RE | Disposition: A | Discharge status: Home / Self Care | Payer: Self-pay | Source: Home / Self Care | Attending: Cardiology

## 2020-07-06 DIAGNOSIS — I2510 Atherosclerotic heart disease of native coronary artery without angina pectoris: Secondary | ICD-10-CM | POA: Diagnosis not present

## 2020-07-06 DIAGNOSIS — Z7982 Long term (current) use of aspirin: Secondary | ICD-10-CM | POA: Diagnosis not present

## 2020-07-06 DIAGNOSIS — R0609 Other forms of dyspnea: Secondary | ICD-10-CM | POA: Diagnosis not present

## 2020-07-06 DIAGNOSIS — E119 Type 2 diabetes mellitus without complications: Secondary | ICD-10-CM | POA: Diagnosis not present

## 2020-07-06 DIAGNOSIS — Z8249 Family history of ischemic heart disease and other diseases of the circulatory system: Secondary | ICD-10-CM | POA: Insufficient documentation

## 2020-07-06 DIAGNOSIS — Z7984 Long term (current) use of oral hypoglycemic drugs: Secondary | ICD-10-CM | POA: Insufficient documentation

## 2020-07-06 DIAGNOSIS — I25118 Atherosclerotic heart disease of native coronary artery with other forms of angina pectoris: Secondary | ICD-10-CM | POA: Diagnosis not present

## 2020-07-06 DIAGNOSIS — E7800 Pure hypercholesterolemia, unspecified: Secondary | ICD-10-CM | POA: Diagnosis not present

## 2020-07-06 DIAGNOSIS — R0600 Dyspnea, unspecified: Secondary | ICD-10-CM | POA: Diagnosis not present

## 2020-07-06 DIAGNOSIS — Z79899 Other long term (current) drug therapy: Secondary | ICD-10-CM | POA: Diagnosis not present

## 2020-07-06 DIAGNOSIS — I10 Essential (primary) hypertension: Secondary | ICD-10-CM | POA: Diagnosis not present

## 2020-07-06 DIAGNOSIS — I2584 Coronary atherosclerosis due to calcified coronary lesion: Secondary | ICD-10-CM | POA: Insufficient documentation

## 2020-07-06 HISTORY — PX: LEFT HEART CATH AND CORONARY ANGIOGRAPHY: CATH118249

## 2020-07-06 LAB — CBC
HCT: 42.9 % (ref 39.0–52.0)
Hemoglobin: 14.7 g/dL (ref 13.0–17.0)
MCH: 30.1 pg (ref 26.0–34.0)
MCHC: 34.3 g/dL (ref 30.0–36.0)
MCV: 87.7 fL (ref 80.0–100.0)
Platelets: 185 10*3/uL (ref 150–400)
RBC: 4.89 MIL/uL (ref 4.22–5.81)
RDW: 12.1 % (ref 11.5–15.5)
WBC: 5.6 10*3/uL (ref 4.0–10.5)
nRBC: 0 % (ref 0.0–0.2)

## 2020-07-06 LAB — BASIC METABOLIC PANEL
Anion gap: 12 (ref 5–15)
BUN: 18 mg/dL (ref 8–23)
CO2: 28 mmol/L (ref 22–32)
Calcium: 9 mg/dL (ref 8.9–10.3)
Chloride: 97 mmol/L — ABNORMAL LOW (ref 98–111)
Creatinine, Ser: 0.88 mg/dL (ref 0.61–1.24)
GFR, Estimated: >60 mL/min (ref >60)
Glucose, Bld: 140 mg/dL — ABNORMAL HIGH (ref 70–99)
Potassium: 3.4 mmol/L — ABNORMAL LOW (ref 3.5–5.1)
Sodium: 137 mmol/L (ref 135–145)

## 2020-07-06 LAB — GLUCOSE, CAPILLARY: Glucose-Capillary: 133 mg/dL — ABNORMAL HIGH (ref 70–99)

## 2020-07-06 SURGERY — LEFT HEART CATH AND CORONARY ANGIOGRAPHY
Anesthesia: LOCAL

## 2020-07-06 MED ORDER — SODIUM CHLORIDE 0.9 % WEIGHT BASED INFUSION: 1.0000 mL/kg/h | INTRAVENOUS | Status: DC

## 2020-07-06 MED ORDER — SODIUM CHLORIDE 0.9% FLUSH: 3.0000 mL | Freq: Two times a day (BID) | INTRAVENOUS | Status: DC

## 2020-07-06 MED ORDER — SODIUM CHLORIDE 0.9% FLUSH: 3.0000 mL | INTRAVENOUS | Status: DC | PRN

## 2020-07-06 MED ORDER — ASPIRIN 81 MG PO CHEW
81.0000 mg | CHEWABLE_TABLET | ORAL | Status: AC
  Administered 2020-07-06: 81 mg via ORAL
  Filled 2020-07-06: qty 1

## 2020-07-06 MED ORDER — NITROGLYCERIN 1 MG/10 ML FOR IR/CATH LAB
INTRA_ARTERIAL | Status: DC | PRN
  Administered 2020-07-06: 200 ug via INTRACORONARY

## 2020-07-06 MED ORDER — LIDOCAINE HCL (PF) 1 % IJ SOLN
INTRAMUSCULAR | Status: AC
  Filled 2020-07-06: qty 30

## 2020-07-06 MED ORDER — FENTANYL CITRATE (PF) 100 MCG/2ML IJ SOLN
INTRAMUSCULAR | Status: AC
  Filled 2020-07-06: qty 2

## 2020-07-06 MED ORDER — MIDAZOLAM HCL 2 MG/2ML IJ SOLN
INTRAMUSCULAR | Status: AC
  Filled 2020-07-06: qty 2

## 2020-07-06 MED ORDER — HEPARIN (PORCINE) IN NACL 1000-0.9 UT/500ML-% IV SOLN
INTRAVENOUS | Status: DC | PRN
  Administered 2020-07-06 (×2): 500 mL

## 2020-07-06 MED ORDER — MIDAZOLAM HCL 2 MG/2ML IJ SOLN
INTRAMUSCULAR | Status: DC | PRN
  Administered 2020-07-06: 2 mg via INTRAVENOUS

## 2020-07-06 MED ORDER — VERAPAMIL HCL 2.5 MG/ML IV SOLN
INTRAVENOUS | Status: AC
  Filled 2020-07-06: qty 2

## 2020-07-06 MED ORDER — HEPARIN SODIUM (PORCINE) 1000 UNIT/ML IJ SOLN
INTRAMUSCULAR | Status: DC | PRN
  Administered 2020-07-06: 4000 [IU] via INTRAVENOUS

## 2020-07-06 MED ORDER — IOHEXOL 350 MG/ML SOLN
INTRAVENOUS | Status: DC | PRN
  Administered 2020-07-06: 90 mL via INTRA_ARTERIAL

## 2020-07-06 MED ORDER — ACETAMINOPHEN 325 MG PO TABS: 650.0000 mg | ORAL_TABLET | ORAL | Status: DC | PRN

## 2020-07-06 MED ORDER — FENTANYL CITRATE (PF) 100 MCG/2ML IJ SOLN
INTRAMUSCULAR | Status: DC | PRN
  Administered 2020-07-06: 50 ug via INTRAVENOUS

## 2020-07-06 MED ORDER — POTASSIUM CHLORIDE CRYS ER 20 MEQ PO TBCR
EXTENDED_RELEASE_TABLET | ORAL | Status: AC
  Filled 2020-07-06: qty 1

## 2020-07-06 MED ORDER — HEPARIN SODIUM (PORCINE) 1000 UNIT/ML IJ SOLN
INTRAMUSCULAR | Status: AC
  Filled 2020-07-06: qty 1

## 2020-07-06 MED ORDER — SODIUM CHLORIDE 0.9 % IV SOLN: 250.0000 mL | INTRAVENOUS | Status: DC | PRN

## 2020-07-06 MED ORDER — ONDANSETRON HCL 4 MG/2ML IJ SOLN: 4.0000 mg | Freq: Four times a day (QID) | INTRAMUSCULAR | Status: DC | PRN

## 2020-07-06 MED ORDER — NITROGLYCERIN 1 MG/10 ML FOR IR/CATH LAB
INTRA_ARTERIAL | Status: AC
  Filled 2020-07-06: qty 10

## 2020-07-06 MED ORDER — METFORMIN HCL ER 500 MG PO TB24: 1000.0000 mg | ORAL_TABLET | Freq: Two times a day (BID) | ORAL | Status: DC

## 2020-07-06 MED ORDER — SODIUM CHLORIDE 0.9 % WEIGHT BASED INFUSION
3.0000 mL/kg/h | INTRAVENOUS | Status: AC
  Administered 2020-07-06: 3 mL/kg/h via INTRAVENOUS

## 2020-07-06 MED ORDER — JANUMET XR 50-1000 MG PO TB24: 1.0000 | ORAL_TABLET | Freq: Two times a day (BID) | ORAL | Status: AC

## 2020-07-06 MED ORDER — POTASSIUM CHLORIDE CRYS ER 20 MEQ PO TBCR
20.0000 meq | EXTENDED_RELEASE_TABLET | Freq: Once | ORAL | Status: AC
  Administered 2020-07-06: 20 meq via ORAL

## 2020-07-06 MED ORDER — LIDOCAINE HCL (PF) 1 % IJ SOLN
INTRAMUSCULAR | Status: DC | PRN
  Administered 2020-07-06: 2 mL via INTRADERMAL

## 2020-07-06 MED ORDER — ISOSORBIDE DINITRATE 30 MG PO TABS: 30.0000 mg | ORAL_TABLET | Freq: Three times a day (TID) | ORAL | 2 refills | Status: DC

## 2020-07-06 MED ORDER — VERAPAMIL HCL 2.5 MG/ML IV SOLN
INTRAVENOUS | Status: DC | PRN
  Administered 2020-07-06: 10 mL via INTRA_ARTERIAL

## 2020-07-06 SURGICAL SUPPLY — 9 items
CATH OPTITORQUE TIG 4.0 5F (CATHETERS) ×2 IMPLANT
DEVICE RAD COMP TR BAND LRG (VASCULAR PRODUCTS) ×2 IMPLANT
GLIDESHEATH SLEND A-KIT 6F 22G (SHEATH) ×2 IMPLANT
GUIDEWIRE INQWIRE 1.5J.035X260 (WIRE) ×1 IMPLANT
INQWIRE 1.5J .035X260CM (WIRE) ×2
KIT HEART LEFT (KITS) ×2 IMPLANT
PACK CARDIAC CATHETERIZATION (CUSTOM PROCEDURE TRAY) ×2 IMPLANT
TRANSDUCER W/STOPCOCK (MISCELLANEOUS) ×2 IMPLANT
TUBING CIL FLEX 10 FLL-RA (TUBING) ×2 IMPLANT

## 2020-07-06 NOTE — Interval H&P Note (Signed)
History and Physical Interval Note:  07/06/2020 7:45 AM  Logan Gonzales  has presented today for surgery, with the diagnosis of chest pain.  The various methods of treatment have been discussed with the patient and family. After consideration of risks, benefits and other options for treatment, the patient has consented to  Procedure(s): LEFT HEART CATH AND CORONARY ANGIOGRAPHY (N/A) and possible angioplasty as a surgical intervention.  The patient's history has been reviewed, patient examined, no change in status, stable for surgery.  I have reviewed the patient's chart and labs.  Questions were answered to the patient's satisfaction.  Cath Lab Visit (complete for each Cath Lab visit)  Clinical Evaluation Leading to the Procedure:   ACS: No.  Non-ACS:    Anginal Classification: CCS III  Anti-ischemic medical therapy: Maximal Therapy (2 or more classes of medications)  Non-Invasive Test Results: No non-invasive testing performed  Prior CABG: No previous CABG     Adrian Prows

## 2020-07-06 NOTE — Discharge Instructions (Signed)
Radial Site Care  This sheet gives you information about how to care for yourself after your procedure. Your health care provider may also give you more specific instructions. If you have problems or questions, contact your health care provider. What can I expect after the procedure? After the procedure, it is common to have:  Bruising and tenderness at the catheter insertion area. Follow these instructions at home: Medicines  Take over-the-counter and prescription medicines only as told by your health care provider. Insertion site care  Follow instructions from your health care provider about how to take care of your insertion site. Make sure you: ? Wash your hands with soap and water before you change your bandage (dressing). If soap and water are not available, use hand sanitizer. ? Change your dressing as told by your health care provider. ? Leave stitches (sutures), skin glue, or adhesive strips in place. These skin closures may need to stay in place for 2 weeks or longer. If adhesive strip edges start to loosen and curl up, you may trim the loose edges. Do not remove adhesive strips completely unless your health care provider tells you to do that.  Check your insertion site every day for signs of infection. Check for: ? Redness, swelling, or pain. ? Fluid or blood. ? Pus or a bad smell. ? Warmth.  Do not take baths, swim, or use a hot tub until your health care provider approves.  You may shower 24-48 hours after the procedure, or as directed by your health care provider. ? Remove the dressing and gently wash the site with plain soap and water. ? Pat the area dry with a clean towel. ? Do not rub the site. That could cause bleeding.  Do not apply powder or lotion to the site. Activity   For 24 hours after the procedure, or as directed by your health care provider: ? Do not flex or bend the affected arm. ? Do not push or pull heavy objects with the affected arm. ? Do not  drive yourself home from the hospital or clinic. You may drive 24 hours after the procedure unless your health care provider tells you not to. ? Do not operate machinery or power tools.  Do not lift anything that is heavier than 10 lb (4.5 kg), or the limit that you are told, until your health care provider says that it is safe.  Ask your health care provider when it is okay to: ? Return to work or school. ? Resume usual physical activities or sports. ? Resume sexual activity. General instructions  If the catheter site starts to bleed, raise your arm and put firm pressure on the site. If the bleeding does not stop, get help right away. This is a medical emergency.  If you went home on the same day as your procedure, a responsible adult should be with you for the first 24 hours after you arrive home.  Keep all follow-up visits as told by your health care provider. This is important. Contact a health care provider if:  You have a fever.  You have redness, swelling, or yellow drainage around your insertion site. Get help right away if:  You have unusual pain at the radial site.  The catheter insertion area swells very fast.  The insertion area is bleeding, and the bleeding does not stop when you hold steady pressure on the area.  Your arm or hand becomes pale, cool, tingly, or numb. These symptoms may represent a serious problem   that is an emergency. Do not wait to see if the symptoms will go away. Get medical help right away. Call your local emergency services (911 in the [place] Do not drive yourself to the hospital. Summary  After the procedure, it is common to have bruising and tenderness at the site.  Follow instructions from your health care provider about how to take care of your radial site wound. Check the wound every day for signs of infection.  Do not lift anything that is heavier than 10 lb (4.5 kg), or the limit that you are told, until your health care provider says  that it is safe. This information is not intended to replace advice given to you by your health care provider. Make sure you discuss any questions you have with your health care provider. Document Revised: 08/08/2017 Document Reviewed: 08/08/2017 Elsevier Patient Education  2020 [employer]  

## 2020-07-06 NOTE — Progress Notes (Signed)
          Left Heart Catheterization 07/06/20:  LV: Low normal LVEF, 55%. Normal EDP. No pressure gradient across the aortic valve. LM: Mildly diffusely diseased. No high-grade stenosis. LAD: Large vessel in the proximal segment however occluded in the midsegment. There is no significant collaterals noted to the mid to distal segment of the LAD. Appears to have a moderate-sized D1 and moderate-sized D3 which are filled by collaterals. D1 origin is proximal to the LAD occlusion and D1 is also occluded in the ostium and mid segment and fills by collaterals. CX: Moderate to large sized vessel with mild calcification and luminal irregularity. RCA: Large vessel, dominant. Mild diffuse calcification noted. Mild luminal irregularity. Gives collaterals to the LAD/diagonals.  Recommendation: The LAD is diffusely diseased and occluded from mid segment all the way down. There is no significant reconstitution, most of the diagonal vessels are also diffusely diseased and are all filled by collaterals from left to left and right to left. There does not appear to be any revascularization options including PCI/CABG. Normal LV systolic function. Recommend optimization with medical therapy. 90 mL contrast utilized.   Adrian Prows, MD, [facility] 07/06/2020, 8:42 AM Office: [phone] Pager: [phone]

## 2020-07-07 MED FILL — Potassium Chloride Microencapsulated Crys ER Tab 20 mEq: ORAL | Qty: 1 | Status: AC

## 2020-07-12 ENCOUNTER — Ambulatory Visit: Payer: PPO | Admitting: Cardiology

## 2020-07-23 ENCOUNTER — Ambulatory Visit: Payer: PPO | Admitting: Cardiology

## 2020-07-23 ENCOUNTER — Encounter: Payer: Self-pay | Admitting: Cardiology

## 2020-07-23 ENCOUNTER — Other Ambulatory Visit: Payer: Self-pay

## 2020-07-23 VITALS — BP 124/65 | HR 59 | Ht 73.5 in | Wt 180.0 lb

## 2020-07-23 DIAGNOSIS — I2510 Atherosclerotic heart disease of native coronary artery without angina pectoris: Secondary | ICD-10-CM

## 2020-07-23 DIAGNOSIS — I700 Atherosclerosis of aorta: Secondary | ICD-10-CM | POA: Diagnosis not present

## 2020-07-23 DIAGNOSIS — I10 Essential (primary) hypertension: Secondary | ICD-10-CM

## 2020-07-23 DIAGNOSIS — I2584 Coronary atherosclerosis due to calcified coronary lesion: Secondary | ICD-10-CM | POA: Diagnosis not present

## 2020-07-23 DIAGNOSIS — Z8249 Family history of ischemic heart disease and other diseases of the circulatory system: Secondary | ICD-10-CM | POA: Diagnosis not present

## 2020-07-23 DIAGNOSIS — E119 Type 2 diabetes mellitus without complications: Secondary | ICD-10-CM | POA: Diagnosis not present

## 2020-07-23 DIAGNOSIS — E7800 Pure hypercholesterolemia, unspecified: Secondary | ICD-10-CM | POA: Diagnosis not present

## 2020-07-23 MED ORDER — ISOSORBIDE MONONITRATE ER 30 MG PO TB24: 30.0000 mg | ORAL_TABLET | Freq: Every day | ORAL | 0 refills | Status: DC

## 2020-07-23 NOTE — Progress Notes (Signed)
Date:  07/23/2020   ID:  Logan Gonzales, DOB 09/22/1945, MRN [id number]  PCP:  Dr. Deland Pretty Cardiologist:  Rex Kras, DO, [facility] (established care 06/14/2020) Former Cardiology Providers: NA  Date: 07/23/20 Last Office Visit: 06/28/2020  Chief Complaint  Patient presents with  . Coronary Artery Disease  . Post heart catheterization    HPI  Logan Gonzales is a 75 y.o. male who presents to the office with a chief complaint of "management of CAD and status post heart catheterization." Patient's past medical history and cardiovascular risk factors include: Established coronary artery disease without angina pectoris, severe coronary artery calcification, Hypertension, non-insulin-dependent diabetes mellitus type 2, hyperlipidemia, advanced age, hx of bladder cancer,  aortic atherosclerosis, family history of CAD, advanced age.  He is referred to the office at the request of Dr. Shelia Media for evaluation of aortic atherosclerosis.  Patient is accompanied by his wife Logan Gonzales) at today's visit.  Patient initially referred to the office after a chest x-ray that noted aortic atherosclerosis.  Given his multiple cardiovascular risk factors he underwent a coronary calcium score which noted a total score of 4656 AU.  Thereafter the shared decision was to proceed with left heart catheterization to evaluate for obstructive CAD.  Patient did undergo left heart catheterization prior to today's visit on July 06, 2020.  He is noted to have diffuse LAD disease with occlusion distal to the mid LAD lesion with collaterals from left to left and right to left.  Patient does not appear to have any revascularization options such as PCI/CABG.  LVEF is overall preserved and patient remains asymptomatic.    FUNCTIONAL STATUS: Walks 1 mile every other day.  ALLERGIES: No Known Allergies  MEDICATION LIST PRIOR TO VISIT: Current Meds  Medication Sig  . amLODipine (NORVASC) 5 MG tablet Take 5 mg by mouth daily.   Marland Kitchen aspirin EC 81 MG tablet Take 81 mg by mouth daily. Swallow whole.  . calcium carbonate (TUMS - DOSED IN MG ELEMENTAL CALCIUM) 500 MG chewable tablet Chew 1,000 mg by mouth daily as needed for indigestion or heartburn.  . fluticasone (FLONASE) 50 MCG/ACT nasal spray Place 1 spray into both nostrils daily.  Marland Kitchen glipiZIDE (GLUCOTROL XL) 2.5 MG 24 hr tablet Take 2.5 mg by mouth daily with breakfast.  . hydrochlorothiazide (HYDRODIURIL) 12.5 MG tablet Take 12.5 mg by mouth 2 (two) times daily.  . isosorbide mononitrate (IMDUR) 30 MG 24 hr tablet Take 1 tablet (30 mg total) by mouth daily.  Marland Kitchen JARDIANCE 25 MG TABS tablet Take 25 mg by mouth daily.  Marland Kitchen losartan (COZAAR) 50 MG tablet Take 50 mg by mouth 2 (two) times daily.  . metoprolol succinate (TOPROL-XL) 50 MG 24 hr tablet Take 50 mg by mouth daily.  . Multiple Vitamin (MULTIVITAMIN WITH MINERALS) TABS tablet Take 1 tablet by mouth daily.  . Omega-3 Fatty Acids (FISH OIL PO) Take 1 capsule by mouth daily.  Marland Kitchen OVER THE COUNTER MEDICATION Take 1 Dose by mouth daily. herbal tonic sipping vinegar blend  . rosuvastatin (CRESTOR) 20 MG tablet Take 20 mg by mouth daily.  . SitaGLIPtin-MetFORMIN HCl (JANUMET XR) 50-1000 MG TB24 Take 1 tablet by mouth 2 (two) times daily.  . [DISCONTINUED] isosorbide dinitrate (ISORDIL) 30 MG tablet Take 1 tablet (30 mg total) by mouth 3 (three) times daily.     PAST MEDICAL HISTORY: Past Medical History:  Diagnosis Date  . Cancer ([place])   . Coronary artery disease due to calcified coronary lesion   .  Diabetes mellitus without complication ([place])   . Hyperlipidemia   . Hypertension     PAST SURGICAL HISTORY: Past Surgical History:  Procedure Laterality Date  . BLADDER SURGERY    . HERNIA REPAIR    . LEFT HEART CATH AND CORONARY ANGIOGRAPHY N/A 07/06/2020   Procedure: LEFT HEART CATH AND CORONARY ANGIOGRAPHY;  Surgeon: Adrian Prows, MD;  Location: [facility];  Service: Cardiovascular;  Laterality: N/A;  .  SHOULDER SURGERY Right     FAMILY HISTORY: The patient family history includes Cancer in his brother, sister, and sister; Emphysema in his mother; Heart disease in his father.  SOCIAL HISTORY:  The patient  reports that he has never smoked. He has never used smokeless tobacco. He reports current alcohol use. He reports that he does not use drugs.  REVIEW OF SYSTEMS: Review of Systems  Constitutional: Negative for chills and fever.  HENT: Negative for hoarse voice and nosebleeds.   Eyes: Negative for discharge, double vision and pain.  Cardiovascular: Negative for chest pain, claudication, dyspnea on exertion, leg swelling, near-syncope, orthopnea, palpitations, paroxysmal nocturnal dyspnea and syncope.  Respiratory: Negative for hemoptysis and shortness of breath.   Musculoskeletal: Negative for muscle cramps and myalgias.  Gastrointestinal: Negative for abdominal pain, constipation, diarrhea, hematemesis, hematochezia, melena, nausea and vomiting.  Neurological: Negative for dizziness and light-headedness.   PHYSICAL EXAM: Vitals with BMI 07/23/2020 07/06/2020 07/06/2020  Height 6' 1.5" - -  Weight 180 lbs - -  BMI 82.95 - -  Systolic 621 308 657  Diastolic 65 69 62  Pulse 59 57 59   CONSTITUTIONAL: Well-developed and well-nourished. No acute distress.  SKIN: Skin is warm and dry. No rash noted. No cyanosis. No pallor. No jaundice HEAD: Normocephalic and atraumatic.  EYES: No scleral icterus MOUTH/THROAT: Moist oral membranes.  NECK: No JVD present. No thyromegaly noted. No carotid bruits  LYMPHATIC: No visible cervical adenopathy.  CHEST Normal respiratory effort. No intercostal retractions  LUNGS: Clear to auscultation bilaterally.  No stridor. No wheezes. No rales.  CARDIOVASCULAR: Regular rate and rhythm, positive Q4-O9, soft holosystolic murmur heard at the apex, no gallops or rubs. ABDOMINAL: No apparent ascites.  EXTREMITIES: No peripheral edema  HEMATOLOGIC: No  significant bruising NEUROLOGIC: Oriented to person, place, and time. Nonfocal. Normal muscle tone.  PSYCHIATRIC: Normal mood and affect. Normal behavior. Cooperative  Radiology: Chest x-ray 06/01/2020: Aortic atherosclerosis.  No radiographic evidence of acute cardiopulmonary disease.  CARDIAC DATABASE: EKG: 06/14/2020: Normal sinus, 70 bpm, T wave inversions in leads V3, V4, and III, without underlying injury pattern.  Echocardiogram: 06/22/2020: Left ventricle cavity is normal in size. Mild concentric hypertrophy of the left ventricle. Normal global wall motion. Normal LV systolic function with EF 63%. Normal diastolic filling pattern.  Left atrial cavity is mildly dilated. Mild (Grade I) mitral regurgitation. Mild tricuspid regurgitation. No evidence of pulmonary hypertension.   Stress Testing: No results found for this or any previous visit from the past 1095 days.  Coronary artery calcification scoring performed on 06/17/2020 at [facility]: Left main: 74 Left anterior descending: 1676 Left circumflex: 576 Right coronary artery: 2275 Posterior descending artery: 55 Total calcium score: 4656 AU, this is between 90-100th percentile for men patient this age.  Heart Catheterization: Left Heart Catheterization 07/06/20:  LV: Low normal LVEF, 55%. Normal EDP. No pressure gradient across the aortic valve. LM: Mildly diffusely diseased. No high-grade stenosis. LAD: Large vessel in the proximal segment however occluded in the midsegment. There is no significant collaterals  noted to the mid to distal segment of the LAD. Appears to have a moderate-sized D1 and moderate-sized D3 which are filled by collaterals. D1 origin is proximal to the LAD occlusion and D1 is also occluded in the ostium and mid segment and fills by collaterals. CX: Moderate to large sized vessel with mild calcification and luminal irregularity. RCA: Large vessel, dominant. Mild diffuse calcification noted. Mild  luminal irregularity. Gives collaterals to the LAD/diagonals.  Recommendation: The LAD is diffusely diseased and occluded from mid segment all the way down. There is no significant reconstitution, most of the diagonal vessels are also diffusely diseased and are all filled by collaterals from left to left and right to left. There does not appear to be any revascularization options including PCI/CABG. Normal LV systolic function. Recommend optimization with medical therapy. 90 mL contrast utilized.  LABORATORY DATA: CBC Latest Ref Rng & Units 07/06/2020 10/14/2010 08/30/2010  WBC 4.0 - 10.5 K/uL 5.6 - -  Hemoglobin 13.0 - 17.0 g/dL 14.7 14.3 15.3  Hematocrit 39.0 - 52.0 % 42.9 42.0 45.0  Platelets 150 - 400 K/uL 185 - -    CMP Latest Ref Rng & Units 07/06/2020 10/14/2010 08/30/2010  Glucose 70 - 99 mg/dL 140(H) 114(H) 92  BUN 8 - 23 mg/dL 18 - -  Creatinine 0.61 - 1.24 mg/dL 0.88 - -  Sodium 135 - 145 mmol/L 137 141 140  Potassium 3.5 - 5.1 mmol/L 3.4(L) 3.9 3.8  Chloride 98 - 111 mmol/L 97(L) - -  CO2 22 - 32 mmol/L 28 - -  Calcium 8.9 - 10.3 mg/dL 9.0 - -  Total Protein 6.0 - 8.3 g/dL - - -  Total Bilirubin 0.3 - 1.2 mg/dL - - -  Alkaline Phos 39 - 117 U/L - - -  AST 0 - 37 U/L - - -  ALT 0 - 53 U/L - - -    Lipid Panel  No results found for: CHOL, TRIG, HDL, CHOLHDL, VLDL, LDLCALC, LDLDIRECT, LABVLDL  No components found for: NTPROBNP No results for input(s): PROBNP in the last 8760 hours. No results for input(s): TSH in the last 8760 hours.  BMP Recent Labs    07/06/20 0600  NA 137  K 3.4*  CL 97*  CO2 28  GLUCOSE 140*  BUN 18  CREATININE 0.88  CALCIUM 9.0  GFRNONAA >60    HEMOGLOBIN A1C No results found for: HGBA1C, MPG  External Labs: Collected: 03/23/2020 Hemoglobin: 15.9 g/dL Creatinine 0.89 mg/dL. eGFR: 84 mL/min per 1.73 m AST 15, ALT 13 Lipid profile: Total cholesterol 114, triglycerides 72, HDL 43, LDL-c 56, non-HDL 58 Hemoglobin A1c:  7.0  IMPRESSION:    ICD-10-CM   1. Atherosclerosis of native coronary artery of native heart without angina pectoris  I25.10 isosorbide mononitrate (IMDUR) 30 MG 24 hr tablet  2. Coronary atherosclerosis due to calcified coronary lesion  I25.10 isosorbide mononitrate (IMDUR) 30 MG 24 hr tablet   I25.84   3. Essential hypertension, benign  I10   4. Non-insulin dependent type 2 diabetes mellitus ([place])  E11.9   5. Hypercholesteremia  E78.00   6. Family history of premature coronary artery disease  Z82.49   7. Atherosclerosis of aorta ([facility])  I70.0      RECOMMENDATIONS: ASHWIN TIBBS is a 75 y.o. male whose past medical history and cardiac risk factors include: Established coronary artery disease without angina pectoris, severe coronary artery calcification, Hypertension, non-insulin-dependent diabetes mellitus type 2, hyperlipidemia, advanced age, hx of bladder cancer,  aortic atherosclerosis, family history of CAD, advanced age.  Atherosclerosis of the native coronary arteries without angina pectoris:  Given his severe coronary artery calcification patient chose to undergo left heart catheterization to evaluate for obstructive CAD.  Based on the most recent left heart catheterization he was noted to have obstructive disease in the mid to distal LAD with left to left and right to left collaterals.   Angiogram films reviewed with the patient and his wife during today's encounter.  Educated on the importance of improving his cardiovascular risk factors.  Educated on importance of glycemic control.  Recommended LDL less than 70 mg/dL.  Continue aspirin and statin therapy.  Patient was given a prescription for Isordil prior to discharge however, patient was unable to tolerate it.  We will start Imdur 30 mg p.o. daily.  Medication profile discussed with the patient.  Patient understands that cannot phosphodiesterase 5 inhibitors while taking isosorbide mononitrate because there are drug  to drug interaction that may cause morbidity, mortality including death. Phosphodiesterase 5 inhibitors include but not limited to Viagra/sildenafil, Cialis/tadalafil, Levitra/vardenafil. Both patient and wife verbalized understanding.   Continue sitagliptin and Jardiance.  Continue Toprol-XL, Cozaar, hydrochlorothiazide, amlodipine.  Dyspnea on exertion: stable. See above.   Non-insulin-dependent diabetes mellitus type 2: Educated on importance of glycemic control.  Benign essential hypertension: Continue current medical therapy.  Office blood pressure is currently at goal.  Low-salt diet recommended.  Currently managed by primary care provider.  FINAL MEDICATION LIST END OF ENCOUNTER: Meds ordered this encounter  Medications  . isosorbide mononitrate (IMDUR) 30 MG 24 hr tablet    Sig: Take 1 tablet (30 mg total) by mouth daily.    Dispense:  90 tablet    Refill:  0    Current Outpatient Medications:  .  amLODipine (NORVASC) 5 MG tablet, Take 5 mg by mouth daily., Disp: , Rfl:  .  aspirin EC 81 MG tablet, Take 81 mg by mouth daily. Swallow whole., Disp: , Rfl:  .  calcium carbonate (TUMS - DOSED IN MG ELEMENTAL CALCIUM) 500 MG chewable tablet, Chew 1,000 mg by mouth daily as needed for indigestion or heartburn., Disp: , Rfl:  .  fluticasone (FLONASE) 50 MCG/ACT nasal spray, Place 1 spray into both nostrils daily., Disp: , Rfl:  .  glipiZIDE (GLUCOTROL XL) 2.5 MG 24 hr tablet, Take 2.5 mg by mouth daily with breakfast., Disp: , Rfl:  .  hydrochlorothiazide (HYDRODIURIL) 12.5 MG tablet, Take 12.5 mg by mouth 2 (two) times daily., Disp: , Rfl:  .  isosorbide mononitrate (IMDUR) 30 MG 24 hr tablet, Take 1 tablet (30 mg total) by mouth daily., Disp: 90 tablet, Rfl: 0 .  JARDIANCE 25 MG TABS tablet, Take 25 mg by mouth daily., Disp: , Rfl:  .  losartan (COZAAR) 50 MG tablet, Take 50 mg by mouth 2 (two) times daily., Disp: , Rfl:  .  metoprolol succinate (TOPROL-XL) 50 MG 24 hr tablet, Take  50 mg by mouth daily., Disp: , Rfl:  .  Multiple Vitamin (MULTIVITAMIN WITH MINERALS) TABS tablet, Take 1 tablet by mouth daily., Disp: , Rfl:  .  Omega-3 Fatty Acids (FISH OIL PO), Take 1 capsule by mouth daily., Disp: , Rfl:  .  OVER THE COUNTER MEDICATION, Take 1 Dose by mouth daily. herbal tonic sipping vinegar blend, Disp: , Rfl:  .  rosuvastatin (CRESTOR) 20 MG tablet, Take 20 mg by mouth daily., Disp: , Rfl:  .  SitaGLIPtin-MetFORMIN HCl (JANUMET XR) 50-1000 MG TB24,  Take 1 tablet by mouth 2 (two) times daily., Disp: 30 tablet, Rfl:   No orders of the defined types were placed in this encounter.   There are no Patient Instructions on file for this visit.   --Continue cardiac medications as reconciled in final medication list. --Return in about 6 months (around 01/20/2021) for Follow up, CAD. Or sooner if needed. --Continue follow-up with your primary care physician regarding the management of your other chronic comorbid conditions.  Patient's questions and concerns were addressed to his satisfaction. He voices understanding of the instructions provided during this encounter.   This note was created using a voice recognition software as a result there may be grammatical errors inadvertently enclosed that do not reflect the nature of this encounter. Every attempt is made to correct such errors.  Rex Kras, [place], [facility]  Pager: [phone] Office: [phone]

## 2020-08-06 DIAGNOSIS — E113293 Type 2 diabetes mellitus with mild nonproliferative diabetic retinopathy without macular edema, bilateral: Secondary | ICD-10-CM | POA: Diagnosis not present

## 2020-08-06 DIAGNOSIS — H25813 Combined forms of age-related cataract, bilateral: Secondary | ICD-10-CM | POA: Diagnosis not present

## 2020-08-06 DIAGNOSIS — H52223 Regular astigmatism, bilateral: Secondary | ICD-10-CM | POA: Diagnosis not present

## 2020-08-06 DIAGNOSIS — H524 Presbyopia: Secondary | ICD-10-CM | POA: Diagnosis not present

## 2020-08-30 DIAGNOSIS — I2510 Atherosclerotic heart disease of native coronary artery without angina pectoris: Secondary | ICD-10-CM | POA: Diagnosis not present

## 2020-08-31 DIAGNOSIS — I2582 Chronic total occlusion of coronary artery: Secondary | ICD-10-CM | POA: Diagnosis not present

## 2020-08-31 DIAGNOSIS — R0602 Shortness of breath: Secondary | ICD-10-CM | POA: Diagnosis not present

## 2020-08-31 DIAGNOSIS — I255 Ischemic cardiomyopathy: Secondary | ICD-10-CM | POA: Diagnosis not present

## 2020-08-31 DIAGNOSIS — I25110 Atherosclerotic heart disease of native coronary artery with unstable angina pectoris: Secondary | ICD-10-CM | POA: Diagnosis not present

## 2020-09-20 DIAGNOSIS — H43813 Vitreous degeneration, bilateral: Secondary | ICD-10-CM | POA: Diagnosis not present

## 2020-09-20 DIAGNOSIS — H25813 Combined forms of age-related cataract, bilateral: Secondary | ICD-10-CM | POA: Diagnosis not present

## 2020-09-20 DIAGNOSIS — H524 Presbyopia: Secondary | ICD-10-CM | POA: Diagnosis not present

## 2020-09-20 DIAGNOSIS — E113293 Type 2 diabetes mellitus with mild nonproliferative diabetic retinopathy without macular edema, bilateral: Secondary | ICD-10-CM | POA: Diagnosis not present

## 2020-09-21 DIAGNOSIS — I10 Essential (primary) hypertension: Secondary | ICD-10-CM | POA: Diagnosis not present

## 2020-09-21 DIAGNOSIS — Z125 Encounter for screening for malignant neoplasm of prostate: Secondary | ICD-10-CM | POA: Diagnosis not present

## 2020-09-21 DIAGNOSIS — E118 Type 2 diabetes mellitus with unspecified complications: Secondary | ICD-10-CM | POA: Diagnosis not present

## 2020-09-27 DIAGNOSIS — I700 Atherosclerosis of aorta: Secondary | ICD-10-CM | POA: Diagnosis not present

## 2020-09-27 DIAGNOSIS — I10 Essential (primary) hypertension: Secondary | ICD-10-CM | POA: Diagnosis not present

## 2020-09-27 DIAGNOSIS — Z0001 Encounter for general adult medical examination with abnormal findings: Secondary | ICD-10-CM | POA: Diagnosis not present

## 2020-09-27 DIAGNOSIS — E118 Type 2 diabetes mellitus with unspecified complications: Secondary | ICD-10-CM | POA: Diagnosis not present

## 2020-09-27 DIAGNOSIS — Z79899 Other long term (current) drug therapy: Secondary | ICD-10-CM | POA: Diagnosis not present

## 2020-09-27 DIAGNOSIS — E113293 Type 2 diabetes mellitus with mild nonproliferative diabetic retinopathy without macular edema, bilateral: Secondary | ICD-10-CM | POA: Diagnosis not present

## 2020-09-27 DIAGNOSIS — E7800 Pure hypercholesterolemia, unspecified: Secondary | ICD-10-CM | POA: Diagnosis not present

## 2020-09-27 DIAGNOSIS — E1136 Type 2 diabetes mellitus with diabetic cataract: Secondary | ICD-10-CM | POA: Diagnosis not present

## 2020-09-27 DIAGNOSIS — I25118 Atherosclerotic heart disease of native coronary artery with other forms of angina pectoris: Secondary | ICD-10-CM | POA: Diagnosis not present

## 2020-10-04 ENCOUNTER — Other Ambulatory Visit: Payer: Self-pay | Admitting: Cardiology

## 2020-10-04 DIAGNOSIS — I2510 Atherosclerotic heart disease of native coronary artery without angina pectoris: Secondary | ICD-10-CM

## 2020-11-24 DIAGNOSIS — I25110 Atherosclerotic heart disease of native coronary artery with unstable angina pectoris: Secondary | ICD-10-CM | POA: Diagnosis not present

## 2020-11-24 DIAGNOSIS — Z7902 Long term (current) use of antithrombotics/antiplatelets: Secondary | ICD-10-CM | POA: Diagnosis not present

## 2020-11-24 DIAGNOSIS — I2582 Chronic total occlusion of coronary artery: Secondary | ICD-10-CM | POA: Diagnosis not present

## 2020-11-24 DIAGNOSIS — Z01812 Encounter for preprocedural laboratory examination: Secondary | ICD-10-CM | POA: Diagnosis not present

## 2020-11-25 DIAGNOSIS — Z79899 Other long term (current) drug therapy: Secondary | ICD-10-CM | POA: Diagnosis not present

## 2020-11-25 DIAGNOSIS — E1169 Type 2 diabetes mellitus with other specified complication: Secondary | ICD-10-CM | POA: Diagnosis not present

## 2020-11-25 DIAGNOSIS — Z7982 Long term (current) use of aspirin: Secondary | ICD-10-CM | POA: Diagnosis not present

## 2020-11-25 DIAGNOSIS — I10 Essential (primary) hypertension: Secondary | ICD-10-CM | POA: Diagnosis not present

## 2020-11-25 DIAGNOSIS — Z955 Presence of coronary angioplasty implant and graft: Secondary | ICD-10-CM

## 2020-11-25 DIAGNOSIS — Z7984 Long term (current) use of oral hypoglycemic drugs: Secondary | ICD-10-CM | POA: Diagnosis not present

## 2020-11-25 DIAGNOSIS — I25118 Atherosclerotic heart disease of native coronary artery with other forms of angina pectoris: Secondary | ICD-10-CM | POA: Diagnosis not present

## 2020-11-25 DIAGNOSIS — I2582 Chronic total occlusion of coronary artery: Secondary | ICD-10-CM | POA: Diagnosis not present

## 2020-11-25 DIAGNOSIS — E782 Mixed hyperlipidemia: Secondary | ICD-10-CM | POA: Diagnosis not present

## 2020-11-25 DIAGNOSIS — I25110 Atherosclerotic heart disease of native coronary artery with unstable angina pectoris: Secondary | ICD-10-CM | POA: Diagnosis not present

## 2020-11-25 HISTORY — DX: Presence of coronary angioplasty implant and graft: Z95.5

## 2020-11-26 DIAGNOSIS — I2582 Chronic total occlusion of coronary artery: Secondary | ICD-10-CM | POA: Diagnosis not present

## 2020-11-29 DIAGNOSIS — J4541 Moderate persistent asthma with (acute) exacerbation: Secondary | ICD-10-CM | POA: Diagnosis not present

## 2020-11-29 DIAGNOSIS — J40 Bronchitis, not specified as acute or chronic: Secondary | ICD-10-CM | POA: Diagnosis not present

## 2020-11-29 DIAGNOSIS — R059 Cough, unspecified: Secondary | ICD-10-CM | POA: Diagnosis not present

## 2020-12-01 DIAGNOSIS — H524 Presbyopia: Secondary | ICD-10-CM | POA: Diagnosis not present

## 2020-12-01 DIAGNOSIS — E113293 Type 2 diabetes mellitus with mild nonproliferative diabetic retinopathy without macular edema, bilateral: Secondary | ICD-10-CM | POA: Diagnosis not present

## 2020-12-01 DIAGNOSIS — H43813 Vitreous degeneration, bilateral: Secondary | ICD-10-CM | POA: Diagnosis not present

## 2020-12-01 DIAGNOSIS — H25813 Combined forms of age-related cataract, bilateral: Secondary | ICD-10-CM | POA: Diagnosis not present

## 2020-12-02 ENCOUNTER — Other Ambulatory Visit: Payer: Self-pay

## 2020-12-02 DIAGNOSIS — J209 Acute bronchitis, unspecified: Secondary | ICD-10-CM | POA: Diagnosis not present

## 2020-12-02 DIAGNOSIS — I2510 Atherosclerotic heart disease of native coronary artery without angina pectoris: Secondary | ICD-10-CM

## 2020-12-02 DIAGNOSIS — I2584 Coronary atherosclerosis due to calcified coronary lesion: Secondary | ICD-10-CM

## 2020-12-02 MED ORDER — ISOSORBIDE MONONITRATE ER 30 MG PO TB24: 30.0000 mg | ORAL_TABLET | Freq: Every day | ORAL | 3 refills | Status: DC

## 2020-12-07 ENCOUNTER — Other Ambulatory Visit: Payer: Self-pay | Admitting: Physician Assistant

## 2020-12-07 DIAGNOSIS — L508 Other urticaria: Secondary | ICD-10-CM | POA: Diagnosis not present

## 2020-12-07 DIAGNOSIS — L509 Urticaria, unspecified: Secondary | ICD-10-CM | POA: Diagnosis not present

## 2020-12-07 DIAGNOSIS — L309 Dermatitis, unspecified: Secondary | ICD-10-CM | POA: Diagnosis not present

## 2020-12-08 DIAGNOSIS — Z955 Presence of coronary angioplasty implant and graft: Secondary | ICD-10-CM | POA: Diagnosis not present

## 2020-12-08 DIAGNOSIS — T50905A Adverse effect of unspecified drugs, medicaments and biological substances, initial encounter: Secondary | ICD-10-CM | POA: Diagnosis not present

## 2020-12-14 DIAGNOSIS — I255 Ischemic cardiomyopathy: Secondary | ICD-10-CM | POA: Diagnosis not present

## 2020-12-14 DIAGNOSIS — Z955 Presence of coronary angioplasty implant and graft: Secondary | ICD-10-CM | POA: Diagnosis not present

## 2020-12-14 DIAGNOSIS — I25110 Atherosclerotic heart disease of native coronary artery with unstable angina pectoris: Secondary | ICD-10-CM | POA: Diagnosis not present

## 2020-12-23 ENCOUNTER — Encounter (HOSPITAL_COMMUNITY): Payer: Self-pay | Admitting: *Deleted

## 2020-12-23 NOTE — Progress Notes (Signed)
Received referral notification from Dr. Jac Canavan at [facility] for this pt to participate in Cardiac rehab s/p 11/25/20 DES to LAD (CTO).  Clinical review of pt follow up appt on 11/26/20  with Nestor Lewandowsky PA with Dr. Jac Canavan - cardiologist office note. Pt is making the expected progress in recovery.  Pt appropriate for scheduling for on site cardiac rehab and/or enrollment in Virtual Cardiac Rehab when the requested documents MD order and 12 lead EKG tracing have been received.  Pt Covid Risk Score is 6.  Will forward to staff for insurance benefits and eligibility with eventual scheduling. Cherre Huger, BSN Cardiac and Training and development officer

## 2020-12-24 ENCOUNTER — Other Ambulatory Visit: Payer: Self-pay | Admitting: Cardiology

## 2020-12-24 DIAGNOSIS — I2584 Coronary atherosclerosis due to calcified coronary lesion: Secondary | ICD-10-CM

## 2020-12-24 DIAGNOSIS — I2510 Atherosclerotic heart disease of native coronary artery without angina pectoris: Secondary | ICD-10-CM

## 2020-12-27 DIAGNOSIS — R634 Abnormal weight loss: Secondary | ICD-10-CM | POA: Diagnosis not present

## 2020-12-27 DIAGNOSIS — R5383 Other fatigue: Secondary | ICD-10-CM | POA: Diagnosis not present

## 2020-12-27 DIAGNOSIS — E118 Type 2 diabetes mellitus with unspecified complications: Secondary | ICD-10-CM | POA: Diagnosis not present

## 2020-12-27 DIAGNOSIS — E789 Disorder of lipoprotein metabolism, unspecified: Secondary | ICD-10-CM | POA: Diagnosis not present

## 2020-12-27 DIAGNOSIS — I10 Essential (primary) hypertension: Secondary | ICD-10-CM | POA: Diagnosis not present

## 2020-12-29 DIAGNOSIS — E7800 Pure hypercholesterolemia, unspecified: Secondary | ICD-10-CM | POA: Diagnosis not present

## 2020-12-29 DIAGNOSIS — I10 Essential (primary) hypertension: Secondary | ICD-10-CM | POA: Diagnosis not present

## 2020-12-29 DIAGNOSIS — R634 Abnormal weight loss: Secondary | ICD-10-CM | POA: Diagnosis not present

## 2020-12-29 DIAGNOSIS — R5383 Other fatigue: Secondary | ICD-10-CM | POA: Diagnosis not present

## 2020-12-29 DIAGNOSIS — H25811 Combined forms of age-related cataract, right eye: Secondary | ICD-10-CM | POA: Diagnosis not present

## 2020-12-29 DIAGNOSIS — H25813 Combined forms of age-related cataract, bilateral: Secondary | ICD-10-CM | POA: Diagnosis not present

## 2020-12-29 DIAGNOSIS — E1136 Type 2 diabetes mellitus with diabetic cataract: Secondary | ICD-10-CM | POA: Diagnosis not present

## 2020-12-29 DIAGNOSIS — Z9861 Coronary angioplasty status: Secondary | ICD-10-CM | POA: Diagnosis not present

## 2020-12-30 ENCOUNTER — Other Ambulatory Visit: Payer: Self-pay

## 2021-01-10 ENCOUNTER — Telehealth (HOSPITAL_COMMUNITY): Payer: Self-pay

## 2021-01-10 NOTE — Telephone Encounter (Signed)
Pt insurance is active and benefits verified through [employer]. Co-pay $15, DED 0/0 met, out of pocket $3,450/$320.18 met, co-insurance 0%. no pre-authorization required. [place]/[employer]. 01/10/2021_0 :54pm, REF# [id number]  Will contact patient to see if he is interested in the Cardiac Rehab Program. If interested, patient will need to complete follow up appt. Once completed, patient will be contacted for scheduling upon review by the RN Navigator.

## 2021-01-10 NOTE — Telephone Encounter (Signed)
Called pt to see if he is interested in the cardiac rehab program, pt stated that he was not interested in the cardiac rehab and that he was doing good. Closed referral.

## 2021-01-11 DIAGNOSIS — H25811 Combined forms of age-related cataract, right eye: Secondary | ICD-10-CM | POA: Diagnosis not present

## 2021-01-11 DIAGNOSIS — H268 Other specified cataract: Secondary | ICD-10-CM | POA: Diagnosis not present

## 2021-01-20 ENCOUNTER — Ambulatory Visit: Payer: PPO | Admitting: Cardiology

## 2021-01-24 DIAGNOSIS — H25812 Combined forms of age-related cataract, left eye: Secondary | ICD-10-CM | POA: Diagnosis not present

## 2021-01-24 DIAGNOSIS — R634 Abnormal weight loss: Secondary | ICD-10-CM | POA: Diagnosis not present

## 2021-01-24 DIAGNOSIS — D649 Anemia, unspecified: Secondary | ICD-10-CM | POA: Diagnosis not present

## 2021-01-24 DIAGNOSIS — R052 Subacute cough: Secondary | ICD-10-CM | POA: Diagnosis not present

## 2021-01-26 DIAGNOSIS — C678 Malignant neoplasm of overlapping sites of bladder: Secondary | ICD-10-CM | POA: Diagnosis not present

## 2021-02-08 DIAGNOSIS — H25812 Combined forms of age-related cataract, left eye: Secondary | ICD-10-CM | POA: Diagnosis not present

## 2021-02-08 DIAGNOSIS — H268 Other specified cataract: Secondary | ICD-10-CM | POA: Diagnosis not present

## 2021-02-22 DIAGNOSIS — D649 Anemia, unspecified: Secondary | ICD-10-CM | POA: Diagnosis not present

## 2021-02-22 DIAGNOSIS — D500 Iron deficiency anemia secondary to blood loss (chronic): Secondary | ICD-10-CM | POA: Diagnosis not present

## 2021-03-08 DIAGNOSIS — I2582 Chronic total occlusion of coronary artery: Secondary | ICD-10-CM | POA: Diagnosis not present

## 2021-03-08 DIAGNOSIS — R0602 Shortness of breath: Secondary | ICD-10-CM | POA: Diagnosis not present

## 2021-03-08 DIAGNOSIS — I2510 Atherosclerotic heart disease of native coronary artery without angina pectoris: Secondary | ICD-10-CM | POA: Diagnosis not present

## 2021-03-08 DIAGNOSIS — I255 Ischemic cardiomyopathy: Secondary | ICD-10-CM | POA: Diagnosis not present

## 2021-03-08 DIAGNOSIS — I25110 Atherosclerotic heart disease of native coronary artery with unstable angina pectoris: Secondary | ICD-10-CM | POA: Diagnosis not present

## 2021-03-08 DIAGNOSIS — Z955 Presence of coronary angioplasty implant and graft: Secondary | ICD-10-CM | POA: Diagnosis not present

## 2021-03-31 DIAGNOSIS — I10 Essential (primary) hypertension: Secondary | ICD-10-CM | POA: Diagnosis not present

## 2021-03-31 DIAGNOSIS — Z9861 Coronary angioplasty status: Secondary | ICD-10-CM | POA: Diagnosis not present

## 2021-03-31 DIAGNOSIS — E118 Type 2 diabetes mellitus with unspecified complications: Secondary | ICD-10-CM | POA: Diagnosis not present

## 2021-03-31 DIAGNOSIS — E7800 Pure hypercholesterolemia, unspecified: Secondary | ICD-10-CM | POA: Diagnosis not present

## 2021-04-11 ENCOUNTER — Other Ambulatory Visit: Payer: Self-pay | Admitting: Cardiology

## 2021-04-11 DIAGNOSIS — I2584 Coronary atherosclerosis due to calcified coronary lesion: Secondary | ICD-10-CM

## 2021-04-11 DIAGNOSIS — I2510 Atherosclerotic heart disease of native coronary artery without angina pectoris: Secondary | ICD-10-CM

## 2021-04-13 DIAGNOSIS — Z23 Encounter for immunization: Secondary | ICD-10-CM | POA: Diagnosis not present

## 2021-04-13 DIAGNOSIS — E118 Type 2 diabetes mellitus with unspecified complications: Secondary | ICD-10-CM | POA: Diagnosis not present

## 2021-04-13 DIAGNOSIS — Z9861 Coronary angioplasty status: Secondary | ICD-10-CM | POA: Diagnosis not present

## 2021-04-13 DIAGNOSIS — I10 Essential (primary) hypertension: Secondary | ICD-10-CM | POA: Diagnosis not present

## 2021-04-13 DIAGNOSIS — E7800 Pure hypercholesterolemia, unspecified: Secondary | ICD-10-CM | POA: Diagnosis not present

## 2021-04-21 DIAGNOSIS — H02052 Trichiasis without entropian right lower eyelid: Secondary | ICD-10-CM | POA: Diagnosis not present

## 2021-04-21 DIAGNOSIS — G245 Blepharospasm: Secondary | ICD-10-CM | POA: Diagnosis not present

## 2021-04-21 DIAGNOSIS — H02042 Spastic entropion of right lower eyelid: Secondary | ICD-10-CM | POA: Diagnosis not present

## 2021-04-21 DIAGNOSIS — Z961 Presence of intraocular lens: Secondary | ICD-10-CM | POA: Diagnosis not present

## 2021-04-21 DIAGNOSIS — H16211 Exposure keratoconjunctivitis, right eye: Secondary | ICD-10-CM | POA: Diagnosis not present

## 2021-04-21 DIAGNOSIS — H02051 Trichiasis without entropian right upper eyelid: Secondary | ICD-10-CM | POA: Diagnosis not present

## 2021-05-10 DIAGNOSIS — E118 Type 2 diabetes mellitus with unspecified complications: Secondary | ICD-10-CM | POA: Diagnosis not present

## 2021-06-23 DIAGNOSIS — E118 Type 2 diabetes mellitus with unspecified complications: Secondary | ICD-10-CM | POA: Diagnosis not present

## 2021-06-23 DIAGNOSIS — E7800 Pure hypercholesterolemia, unspecified: Secondary | ICD-10-CM | POA: Diagnosis not present

## 2021-06-30 DIAGNOSIS — I25118 Atherosclerotic heart disease of native coronary artery with other forms of angina pectoris: Secondary | ICD-10-CM | POA: Diagnosis not present

## 2021-06-30 DIAGNOSIS — I10 Essential (primary) hypertension: Secondary | ICD-10-CM | POA: Diagnosis not present

## 2021-06-30 DIAGNOSIS — E7800 Pure hypercholesterolemia, unspecified: Secondary | ICD-10-CM | POA: Diagnosis not present

## 2021-06-30 DIAGNOSIS — R634 Abnormal weight loss: Secondary | ICD-10-CM | POA: Diagnosis not present

## 2021-06-30 DIAGNOSIS — E1136 Type 2 diabetes mellitus with diabetic cataract: Secondary | ICD-10-CM | POA: Diagnosis not present

## 2021-07-03 ENCOUNTER — Other Ambulatory Visit: Payer: Self-pay | Admitting: Cardiology

## 2021-07-03 DIAGNOSIS — I2510 Atherosclerotic heart disease of native coronary artery without angina pectoris: Secondary | ICD-10-CM

## 2021-07-03 DIAGNOSIS — I2584 Coronary atherosclerosis due to calcified coronary lesion: Secondary | ICD-10-CM

## 2021-08-03 ENCOUNTER — Telehealth (HOSPITAL_COMMUNITY): Payer: Self-pay

## 2021-08-03 NOTE — Telephone Encounter (Signed)
Pt insurance is active and benefits verified through [employer]. Co-pay $15.00, DED $0.00/$0.00 met, out of pocket $3,200.00/$0.00 met, co-insurance 0%. No pre-authorization required. [place]/[employer], 08/03/21 @ 12:25, REF# [id number]

## 2021-08-04 NOTE — Telephone Encounter (Signed)
Called patient to see if he was interested in participating in the Cardiac Rehab Program. Patient stated yes. Patient will come in for orientation on 08/25/21 @ 930AM and will attend the 1030AM exercise class.   Tourist information centre manager.

## 2021-08-10 DIAGNOSIS — R634 Abnormal weight loss: Secondary | ICD-10-CM | POA: Diagnosis not present

## 2021-08-16 DIAGNOSIS — E875 Hyperkalemia: Secondary | ICD-10-CM | POA: Diagnosis not present

## 2021-08-16 DIAGNOSIS — R739 Hyperglycemia, unspecified: Secondary | ICD-10-CM | POA: Diagnosis not present

## 2021-08-23 DIAGNOSIS — E875 Hyperkalemia: Secondary | ICD-10-CM | POA: Diagnosis not present

## 2021-08-24 ENCOUNTER — Telehealth (HOSPITAL_COMMUNITY): Payer: Self-pay | Admitting: Internal Medicine

## 2021-08-24 ENCOUNTER — Other Ambulatory Visit: Payer: Self-pay | Admitting: Cardiology

## 2021-08-24 ENCOUNTER — Telehealth (HOSPITAL_COMMUNITY): Payer: Self-pay | Admitting: *Deleted

## 2021-08-24 DIAGNOSIS — I2510 Atherosclerotic heart disease of native coronary artery without angina pectoris: Secondary | ICD-10-CM

## 2021-08-24 NOTE — Telephone Encounter (Signed)
Left message to call cardiac rehab regarding upcoming for orientation.Barnet Pall, RN,BSN 08/24/2021 12:06 PM

## 2021-08-25 ENCOUNTER — Encounter (HOSPITAL_COMMUNITY): Payer: Self-pay

## 2021-08-25 ENCOUNTER — Encounter (HOSPITAL_COMMUNITY)
Admission: RE | Admit: 2021-08-25 | Discharge: 2021-08-25 | Disposition: A | Discharge status: Home / Self Care | Payer: PPO | Source: Ambulatory Visit | Attending: Cardiology | Admitting: Cardiology

## 2021-08-25 ENCOUNTER — Other Ambulatory Visit: Payer: Self-pay

## 2021-08-25 VITALS — BP 90/47 | HR 63 | Ht 72.75 in | Wt 165.3 lb

## 2021-08-25 DIAGNOSIS — Z955 Presence of coronary angioplasty implant and graft: Secondary | ICD-10-CM | POA: Diagnosis not present

## 2021-08-25 LAB — GLUCOSE, CAPILLARY: Glucose-Capillary: 142 mg/dL — ABNORMAL HIGH (ref 70–99)

## 2021-08-25 NOTE — Progress Notes (Signed)
Cardiac Rehab Medication Review by a Nurse  Does the patient  feel that his/her medications are working for him/her?  YES   Has the patient been experiencing any side effects to the medications prescribed?  no  Does the patient measure his/her own blood pressure or blood glucose at home?  no   Does the patient have any problems obtaining medications due to transportation or finances?   no  Understanding of regimen: poor Understanding of indications: fair Potential of compliance: good    Nurse comments: Logan Gonzales is taking his medications as prescribed although he said that he stopped taking his isosorbide because he thought he was taking too much medicine. Logan Gonzales said that he started taking the medication again as he started to feel funny.Logan Gonzales said that he does not have a home BP monitor or cuff to check his blood pressures at home. Logan Gonzales reports that he feels lightheaded almost on daily basis. Logan Gonzales has a CBG meter he does not have any strips at this time. Logan Gonzales does not have a BP cuff or monitor at home.    [name] RN 08/25/2021 9:59 AM

## 2021-08-25 NOTE — Progress Notes (Signed)
Initial blood pressure 90/47 via automatic BP cuff. Manual BP systolic heard in the 12'X. Patient was given water. Recheck BP 91/49 sitting. Standing BP 86/51 standing heart rate 61. Telemetry rhythm Sinus. "Logan Gonzales" reported feeling  little light headed when changing positions. Patient was given water. Repeat blood pressure 99/53 sitting. Standing blood pressure 92/60. Symptoms resolved after water was given to the patient. Dr Valentina Lucks office at [facility] called and notified. Spoke with Posey Boyer RN. Okay given to proceed with 6 minute walk test and exercise. Remo Lipps spoke with Mr Wolfson over the phone and told the patient and told him they will call him in regard to his medications as he reports feeling lightheaded every daily. Logan Gonzales completed his 6 minute walk test without difficulty.Will fax today's walk test data, ECG tracing and vital signs  to Dr. Clementeen Hoof  office for review. Harrell Gave RN BSN

## 2021-08-25 NOTE — Progress Notes (Signed)
Cardiac Individual Treatment Plan  Patient Details  Name: Logan Gonzales MRN: [id number] Date of Birth: [birth year] Referring Provider:   Flowsheet Row CARDIAC REHAB PHASE II ORIENTATION from 08/25/2021 in [place]  Referring Provider Dr Loni Beckwith MD, [facility]/ Dr Fransico Him MD Covering       Initial Encounter Date:  [place] from 08/25/2021 in [place]  Date 08/25/21       Visit Diagnosis: 11/25/20 S/P CTO DES LAD at [facility] Dr Jac Canavan  Patient's Home Medications on Admission:  Current Outpatient Medications:    amLODipine (NORVASC) 10 MG tablet, Take 10 mg by mouth daily., Disp: , Rfl:    aspirin EC 81 MG tablet, Take 81 mg by mouth daily. Swallow whole., Disp: , Rfl:    calcium carbonate (TUMS - DOSED IN MG ELEMENTAL CALCIUM) 500 MG chewable tablet, Chew 1,000 mg by mouth daily as needed for indigestion or heartburn., Disp: , Rfl:    Coenzyme Q10-Vitamin E (QUNOL ULTRA COQ10 PO), Take 15 mLs by mouth daily., Disp: , Rfl:    fluticasone (FLONASE) 50 MCG/ACT nasal spray, Place 1 spray into both nostrils daily., Disp: , Rfl:    isosorbide mononitrate (IMDUR) 30 MG 24 hr tablet, Take 1 tablet by mouth once daily, Disp: 30 tablet, Rfl: 0   JARDIANCE 25 MG TABS tablet, Take 25 mg by mouth daily., Disp: , Rfl:    KERENDIA 10 MG TABS, Take 10 mg by mouth daily., Disp: , Rfl:    losartan (COZAAR) 25 MG tablet, Take 25 mg by mouth 2 (two) times daily., Disp: , Rfl:    METAMUCIL FIBER PO, Take 2 Scoops by mouth every evening., Disp: , Rfl:    metoprolol succinate (TOPROL-XL) 25 MG 24 hr tablet, Take 25 mg by mouth daily., Disp: , Rfl:    Multiple Vitamin (MULTIVITAMIN WITH MINERALS) TABS tablet, Take 1 tablet by mouth daily., Disp: , Rfl:    nitroGLYCERIN (NITROSTAT) 0.4 MG SL tablet, Place 0.4 mg under the tongue every 5 (five) minutes as needed for chest pain., Disp: ,  Rfl:    ranolazine (RANEXA) 500 MG 12 hr tablet, Take 500 mg by mouth 2 (two) times daily., Disp: , Rfl:    rosuvastatin (CRESTOR) 40 MG tablet, Take 40 mg by mouth at bedtime., Disp: , Rfl:    SitaGLIPtin-MetFORMIN HCl (JANUMET XR) 50-1000 MG TB24, Take 1 tablet by mouth 2 (two) times daily., Disp: 30 tablet, Rfl:    ticagrelor (BRILINTA) 90 MG TABS tablet, Take 90 mg by mouth 2 (two) times daily., Disp: , Rfl:    Turmeric 500 MG CAPS, Take 500 mg by mouth daily., Disp: , Rfl:   Past Medical History: Past Medical History:  Diagnosis Date   Cancer ([place])    Coronary artery disease due to calcified coronary lesion    Diabetes mellitus without complication ([place])    Hyperlipidemia    Hypertension    S/P drug eluting coronary stent placement 11/25/2020   CTO at [facility] Dr Jac Canavan    Tobacco Use: Social History   Tobacco Use  Smoking Status Never  Smokeless Tobacco Never    Labs: Recent Review Flowsheet Data   There is no flowsheet data to display.     Capillary Blood Glucose: Lab Results  Component Value Date   GLUCAP 142 (H) 08/25/2021   GLUCAP 133 (H) 07/06/2020   GLUCAP 129 (H) 10/14/2010  GLUCAP 117 (H) 08/30/2010   GLUCAP 166 (H) 08/17/2008     Exercise Target Goals: Exercise Program Goal: Individual exercise prescription set using results from initial 6 min walk test and THRR while considering  patients activity barriers and safety.   Exercise Prescription Goal: Starting with aerobic activity 30 plus minutes a day, 3 days per week for initial exercise prescription. Provide home exercise prescription and guidelines that participant acknowledges understanding prior to discharge.  Activity Barriers & Risk Stratification:  Activity Barriers & Cardiac Risk Stratification - 08/25/21 1153       Activity Barriers & Cardiac Risk Stratification   Activity Barriers Deconditioning;Back Problems    Cardiac Risk Stratification High             6 Minute  Walk:  6 Minute Walk     Row Name 08/25/21 1150         6 Minute Walk   Phase Initial     Distance 1675 feet     Walk Time 6 minutes     # of Rest Breaks 0     MPH 3.17     METS 3.42     RPE 9     Perceived Dyspnea  0     VO2 Peak 11.95     Symptoms Yes (comment)     Comments Entry BP low. 90/47 after H2O sitting 91/49, stand 86/51. Pt was symptomatic, Slight dizzy. Later recheck 99/53 asymptomatic.     Resting HR 63 bpm     Resting BP 90/47     Resting Oxygen Saturation  97 %     Exercise Oxygen Saturation  during 6 min walk 99 %     Max Ex. HR 73 bpm     Max Ex. BP 128/57     2 Minute Post BP 108/56              Oxygen Initial Assessment:   Oxygen Re-Evaluation:   Oxygen Discharge (Final Oxygen Re-Evaluation):   Initial Exercise Prescription:  Initial Exercise Prescription - 08/25/21 1100       Date of Initial Exercise RX and Referring Provider   Date 08/25/21    Referring Provider Dr Loni Beckwith MD, [facility]/ Dr Fransico Him MD Covering    Expected Discharge Date 10/21/21      Treadmill   MPH 3.2    Grade 0    Minutes 15    METs 3.45      Bike   Level 1.2    Minutes 15    METs 2.5      Prescription Details   Frequency (times per week) 3    Duration Progress to 30 minutes of continuous aerobic without signs/symptoms of physical distress      Intensity   THRR 40-80% of Max Heartrate 58-115    Ratings of Perceived Exertion 11-13    Perceived Dyspnea 0-4      Progression   Progression Continue progressive overload as per policy without signs/symptoms or physical distress.      Resistance Training   Training Prescription Yes    Weight 5    Reps 10-15             Perform Capillary Blood Glucose checks as needed.  Exercise Prescription Changes:   Exercise Comments:   Exercise Goals and Review:   Exercise Goals     Row Name 08/25/21 1157             Exercise Goals  Increase Physical Activity Yes        Intervention Provide advice, education, support and counseling about physical activity/exercise needs.;Develop an individualized exercise prescription for aerobic and resistive training based on initial evaluation findings, risk stratification, comorbidities and participant's personal goals.       Expected Outcomes Short Term: Attend rehab on a regular basis to increase amount of physical activity.;Long Term: Add in home exercise to make exercise part of routine and to increase amount of physical activity.;Long Term: Exercising regularly at least 3-5 days a week.       Increase Strength and Stamina Yes       Intervention Provide advice, education, support and counseling about physical activity/exercise needs.;Develop an individualized exercise prescription for aerobic and resistive training based on initial evaluation findings, risk stratification, comorbidities and participant's personal goals.       Expected Outcomes Short Term: Increase workloads from initial exercise prescription for resistance, speed, and METs.;Short Term: Perform resistance training exercises routinely during rehab and add in resistance training at home;Long Term: Improve cardiorespiratory fitness, muscular endurance and strength as measured by increased METs and functional capacity (6MWT)       Able to understand and use rate of perceived exertion (RPE) scale Yes       Intervention Provide education and explanation on how to use RPE scale       Expected Outcomes Short Term: Able to use RPE daily in rehab to express subjective intensity level;Long Term:  Able to use RPE to guide intensity level when exercising independently       Knowledge and understanding of Target Heart Rate Range (THRR) Yes       Intervention Provide education and explanation of THRR including how the numbers were predicted and where they are located for reference       Expected Outcomes Short Term: Able to state/look up THRR;Long Term: Able to use THRR to govern  intensity when exercising independently;Short Term: Able to use daily as guideline for intensity in rehab       Understanding of Exercise Prescription Yes       Intervention Provide education, explanation, and written materials on patient's individual exercise prescription       Expected Outcomes Short Term: Able to explain program exercise prescription;Long Term: Able to explain home exercise prescription to exercise independently                Exercise Goals Re-Evaluation :    Discharge Exercise Prescription (Final Exercise Prescription Changes):   Nutrition:  Target Goals: Understanding of nutrition guidelines, daily intake of sodium 1500mg , cholesterol 200mg , calories 30% from fat and 7% or less from saturated fats, daily to have 5 or more servings of fruits and vegetables.  Biometrics:  Pre Biometrics - 08/25/21 1149       Pre Biometrics   Waist Circumference 34 inches    Hip Circumference 35.75 inches    Waist to Hip Ratio 0.95 %    Triceps Skinfold 4 mm    % Body Fat 17.4 %    Grip Strength 29 kg    Flexibility 9 in    Single Leg Stand 19.7 seconds              Nutrition Therapy Plan and Nutrition Goals:   Nutrition Assessments:  MEDIFICTS Score Key: ?70 Need to make dietary changes  40-70 Heart Healthy Diet ? 40 Therapeutic Level Cholesterol Diet   Picture Your Plate Scores: <50 Unhealthy dietary pattern with much room for improvement.  41-50 Dietary pattern unlikely to meet recommendations for good health and room for improvement. 51-60 More healthful dietary pattern, with some room for improvement.  >60 Healthy dietary pattern, although there may be some specific behaviors that could be improved.    Nutrition Goals Re-Evaluation:   Nutrition Goals Discharge (Final Nutrition Goals Re-Evaluation):   Psychosocial: Target Goals: Acknowledge presence or absence of significant depression and/or stress, maximize coping skills, provide positive  support system. Participant is able to verbalize types and ability to use techniques and skills needed for reducing stress and depression.  Initial Review & Psychosocial Screening:  Initial Psych Review & Screening - 08/25/21 1344       Initial Review   Current issues with Current Stress Concerns    Source of Stress Concerns Chronic Illness    Comments Waunita Schooner is concerned about his health as he has lost 20 pounds in the past few months      [place]? Yes   Waunita Schooner has his wife and son who live in the area for support     Barriers   Psychosocial barriers to participate in program The patient should benefit from training in stress management and relaxation.      Screening Interventions   Interventions Encouraged to exercise             Quality of Life Scores:  Quality of Life - 08/25/21 1159       Quality of Life   Select Quality of Life      Quality of Life Scores   Health/Function Pre 27.2 %    Socioeconomic Pre 27.21 %    Psych/Spiritual Pre 30 %    Family Pre 27.6 %    GLOBAL Pre 27.84 %            Scores of 19 and below usually indicate a poorer quality of life in these areas.  A difference of  2-3 points is a clinically meaningful difference.  A difference of 2-3 points in the total score of the Quality of Life Index has been associated with significant improvement in overall quality of life, self-image, physical symptoms, and general health in studies assessing change in quality of life.  PHQ-9: Recent Review Flowsheet Data     Depression screen [facility] 2/9 08/25/2021   Decreased Interest 0   Down, Depressed, Hopeless 0   PHQ - 2 Score 0      Interpretation of Total Score  Total Score Depression Severity:  1-4 = Minimal depression, 5-9 = Mild depression, 10-14 = Moderate depression, 15-19 = Moderately severe depression, 20-27 = Severe depression   Psychosocial Evaluation and Intervention:   Psychosocial  Re-Evaluation:   Psychosocial Discharge (Final Psychosocial Re-Evaluation):   Vocational Rehabilitation: Provide vocational rehab assistance to qualifying candidates.   Vocational Rehab Evaluation & Intervention:  Vocational Rehab - 08/25/21 1346       Initial Vocational Rehab Evaluation & Intervention   Assessment shows need for Vocational Rehabilitation No   Waunita Schooner is retired and does not need vocational rehab at this time            Education: Education Goals: Education classes will be provided on a weekly basis, covering required topics. Participant will state understanding/return demonstration of topics presented.  Learning Barriers/Preferences:  Learning Barriers/Preferences - 08/25/21 1159       Learning Barriers/Preferences   Learning Barriers Hearing;Exercise Concerns;Sight   wears glasses, HOH and occasionally dizzy   Learning Preferences Group Instruction;Individual Instruction;Skilled  Demonstration;Verbal Instruction             Education Topics: Hypertension, Hypertension Reduction -Define heart disease and high blood pressure. Discus how high blood pressure affects the body and ways to reduce high blood pressure.   Exercise and Your Heart -Discuss why it is important to exercise, the FITT principles of exercise, normal and abnormal responses to exercise, and how to exercise safely.   Angina -Discuss definition of angina, causes of angina, treatment of angina, and how to decrease risk of having angina.   Cardiac Medications -Review what the following cardiac medications are used for, how they affect the body, and side effects that may occur when taking the medications.  Medications include Aspirin, Beta blockers, calcium channel blockers, ACE Inhibitors, angiotensin receptor blockers, diuretics, digoxin, and antihyperlipidemics.   Congestive Heart Failure -Discuss the definition of CHF, how to live with CHF, the signs and symptoms of CHF, and how  keep track of weight and sodium intake.   Heart Disease and Intimacy -Discus the effect sexual activity has on the heart, how changes occur during intimacy as we age, and safety during sexual activity.   Smoking Cessation / COPD -Discuss different methods to quit smoking, the health benefits of quitting smoking, and the definition of COPD.   Nutrition I: Fats -Discuss the types of cholesterol, what cholesterol does to the heart, and how cholesterol levels can be controlled.   Nutrition II: Labels -Discuss the different components of food labels and how to read food label   Heart Parts/Heart Disease and PAD -Discuss the anatomy of the heart, the pathway of blood circulation through the heart, and these are affected by heart disease.   Stress I: Signs and Symptoms -Discuss the causes of stress, how stress may lead to anxiety and depression, and ways to limit stress.   Stress II: Relaxation -Discuss different types of relaxation techniques to limit stress.   Warning Signs of Stroke / TIA -Discuss definition of a stroke, what the signs and symptoms are of a stroke, and how to identify when someone is having stroke.   Knowledge Questionnaire Score:  Knowledge Questionnaire Score - 08/25/21 1201       Knowledge Questionnaire Score   Pre Score 19/24             Core Components/Risk Factors/Patient Goals at Admission:  Personal Goals and Risk Factors at Admission - 08/25/21 1202       Core Components/Risk Factors/Patient Goals on Admission    Weight Management Yes;Weight Maintenance   Lost weight, but feels weak and wants muscle strength   Intervention Weight Management: Develop a combined nutrition and exercise program designed to reach desired caloric intake, while maintaining appropriate intake of nutrient and fiber, sodium and fats, and appropriate energy expenditure required for the weight goal.;Weight Management: Provide education and appropriate resources to help  participant work on and attain dietary goals.    Admit Weight 165 lb 5.5 oz (75 kg)    Expected Outcomes Weight Maintenance: Understanding of the daily nutrition guidelines, which includes 25-35% calories from fat, 7% or less cal from saturated fats, less than 200mg  cholesterol, less than 1.5gm of sodium, & 5 or more servings of fruits and vegetables daily;Short Term: Continue to assess and modify interventions until short term weight is achieved;Long Term: Adherence to nutrition and physical activity/exercise program aimed toward attainment of established weight goal;Weight Gain: Understanding of general recommendations for a high calorie, high protein meal plan that promotes weight gain by distributing  calorie intake throughout the day with the consumption for 4-5 meals, snacks, and/or supplements;Understanding of distribution of calorie intake throughout the day with the consumption of 4-5 meals/snacks;Understanding recommendations for meals to include 15-35% energy as protein, 25-35% energy from fat, 35-60% energy from carbohydrates, less than 200mg  of dietary cholesterol, 20-35 gm of total fiber daily    Diabetes Yes    Intervention Provide education about signs/symptoms and action to take for hypo/hyperglycemia.;Provide education about proper nutrition, including hydration, and aerobic/resistive exercise prescription along with prescribed medications to achieve blood glucose in normal ranges: Fasting glucose 65-99 mg/dL    Expected Outcomes Short Term: Participant verbalizes understanding of the signs/symptoms and immediate care of hyper/hypoglycemia, proper foot care and importance of medication, aerobic/resistive exercise and nutrition plan for blood glucose control.;Long Term: Attainment of HbA1C < 7%.    Hypertension Yes    Intervention Provide education on lifestyle modifcations including regular physical activity/exercise, weight management, moderate sodium restriction and increased consumption of  fresh fruit, vegetables, and low fat dairy, alcohol moderation, and smoking cessation.;Monitor prescription use compliance.    Expected Outcomes Short Term: Continued assessment and intervention until BP is < 140/40mm HG in hypertensive participants. < 130/25mm HG in hypertensive participants with diabetes, heart failure or chronic kidney disease.;Long Term: Maintenance of blood pressure at goal levels.    Lipids Yes    Intervention Provide education and support for participant on nutrition & aerobic/resistive exercise along with prescribed medications to achieve LDL 70mg , HDL >40mg .    Expected Outcomes Short Term: Participant states understanding of desired cholesterol values and is compliant with medications prescribed. Participant is following exercise prescription and nutrition guidelines.;Long Term: Cholesterol controlled with medications as prescribed, with individualized exercise RX and with personalized nutrition plan. Value goals: LDL < 70mg , HDL > 40 mg.    Stress Yes    Intervention Offer individual and/or small group education and counseling on adjustment to heart disease, stress management and health-related lifestyle change. Teach and support self-help strategies.;Refer participants experiencing significant psychosocial distress to appropriate mental health specialists for further evaluation and treatment. When possible, include family members and significant others in education/counseling sessions.    Expected Outcomes Short Term: Participant demonstrates changes in health-related behavior, relaxation and other stress management skills, ability to obtain effective social support, and compliance with psychotropic medications if prescribed.;Long Term: Emotional wellbeing is indicated by absence of clinically significant psychosocial distress or social isolation.    Personal Goal Other Yes    Personal Goal Long and short the same: build strength, make lifestyle changes and change diet     Intervention Will continue to monitor pt and progress workloads as tolerated without sign or symptom    Expected Outcomes Pt will achieve his goals and gain strength and follow a heart healthy and DM managed lifestyle             Core Components/Risk Factors/Patient Goals Review:    Core Components/Risk Factors/Patient Goals at Discharge (Final Review):    ITP Comments:  ITP Comments     Row Name 08/25/21 1145           ITP Comments Dr Fransico Him MD, Medical Director                Comments: Herbie Baltimore attended orientation on 08/25/2021 to review rules and guidelines for program.  Completed 6 minute walk test, Intitial ITP, and exercise prescription.See previous note for full vital signs. Telemetry-Sinus Rhythm.  Asymptomatic after water was given to the patient. Safety  measures and social distancing in place per CDC guidelines.Harrell Gave RN BSN

## 2021-08-29 ENCOUNTER — Other Ambulatory Visit: Payer: Self-pay

## 2021-08-29 ENCOUNTER — Encounter (HOSPITAL_COMMUNITY)
Admission: RE | Admit: 2021-08-29 | Discharge: 2021-08-29 | Disposition: A | Discharge status: Home / Self Care | Payer: PPO | Source: Ambulatory Visit | Attending: Cardiology | Admitting: Cardiology

## 2021-08-29 DIAGNOSIS — Z955 Presence of coronary angioplasty implant and graft: Secondary | ICD-10-CM | POA: Diagnosis not present

## 2021-08-29 LAB — GLUCOSE, CAPILLARY
Glucose-Capillary: 177 mg/dL — ABNORMAL HIGH (ref 70–99)
Glucose-Capillary: 142 mg/dL — ABNORMAL HIGH (ref 70–99)

## 2021-08-29 NOTE — Progress Notes (Signed)
Daily Session Note  Patient Details  Name: Logan Gonzales MRN: [id number] Date of Birth: [birth year] Referring Provider:   Flowsheet Row CARDIAC REHAB PHASE II ORIENTATION from 08/25/2021 in [place]  Referring Provider Dr Loni Beckwith MD, [facility]/ Dr Fransico Him MD Covering       Encounter Date: 08/29/2021  Check In:  Session Check In - 08/29/21 1059       Check-In   Supervising physician immediately available to respond to emergencies [facility] immediately available    Physician(s) Dr Princella Ion    Location [facility]    Staff Present Barnet Pall, RN, BSN;Jetta Walker BS, ACSM EP-C, Exercise Physiologist;Olinty Celesta Aver, MS, ACSM CEP, Exercise Physiologist;David Makemson, MS, ACSM-CEP, CCRP, Exercise Physiologist    Virtual Visit No    Medication changes reported     Yes    Comments Amlodipine D/C    Fall or balance concerns reported    No    Tobacco Cessation No Change    Warm-up and Cool-down Performed as group-led instruction    Resistance Training Performed Yes    VAD Patient? No    PAD/SET Patient? No      Pain Assessment   Currently in Pain? No/denies    Pain Score 0-No pain    Multiple Pain Sites No             Capillary Blood Glucose: Results for orders placed or performed during the hospital encounter of 08/29/21 (from the past 24 hour(s))  Glucose, capillary     Status: Abnormal   Collection Time: 08/29/21 10:18 AM  Result Value Ref Range   Glucose-Capillary 177 (H) 70 - 99 mg/dL  Glucose, capillary     Status: Abnormal   Collection Time: 08/29/21 11:13 AM  Result Value Ref Range   Glucose-Capillary 142 (H) 70 - 99 mg/dL     Exercise Prescription Changes - 08/29/21 1022       Response to Exercise   Blood Pressure (Admit) 102/60    Blood Pressure (Exercise) 132/58    Blood Pressure (Exit) 96/60    Heart Rate (Admit) 67 bpm    Heart Rate (Exercise) 90 bpm    Heart Rate (Exit) 75  bpm    Rating of Perceived Exertion (Exercise) 11    Symptoms None    Comments Off to a good start with exercise.    Duration Continue with 30 min of aerobic exercise without signs/symptoms of physical distress.    Intensity THRR unchanged      Progression   Progression Continue to progress workloads to maintain intensity without signs/symptoms of physical distress.    Average METs 3.7      Resistance Training   Training Prescription Yes    Weight 5    Reps 10-15    Time 10 Minutes      Interval Training   Interval Training No      Treadmill   MPH 3.2    Grade 0    Minutes 15    METs 3.45      Bike   Level 1.2    Minutes 15    METs 4.02             Social History   Tobacco Use  Smoking Status Never  Smokeless Tobacco Never    Goals Met:  Exercise tolerated well No report of concerns or symptoms today Strength training completed today  Goals Unmet:  Not Applicable  Comments:  Logan Gonzales started cardiac rehab today.  Pt tolerated light exercise without difficulty. VSS, telemetry-Sinus Rhythm, asymptomatic.  Medication list reconciled. Pt denies barriers to medicaiton compliance.  PSYCHOSOCIAL ASSESSMENT:  PHQ-0. Pt exhibits positive coping skills, hopeful outlook with supportive family. No psychosocial needs identified at this time, no psychosocial interventions necessary.    Pt enjoys fly fishing salt water fishing and boating.   Pt oriented to exercise equipment and routine.    Understanding verbalized. Is was noted that Logan Gonzales's Amlodipine was discontinued by Dr Elonda Husky. Mr Ackman's cardiologist. Logan Gonzales hopes that he will gain weight while in the program.   Dr. Fransico Him is Medical Director for Cardiac Rehab at [facility].

## 2021-08-29 NOTE — Progress Notes (Signed)
Cardiac Individual Treatment Plan  Patient Details  Name: Logan Gonzales MRN: [id number] Date of Birth: 05-Apr-1946 Referring Provider:   Flowsheet Row CARDIAC REHAB PHASE II ORIENTATION from 08/25/2021 in [place]  Referring Provider Dr Loni Beckwith MD, [facility]/ Dr Fransico Him MD Covering       Initial Encounter Date:  [place] from 08/25/2021 in [place]  Date 08/25/21       Visit Diagnosis: 11/25/20 S/P CTO DES LAD at [facility] Dr Jac Canavan  Patient's Home Medications on Admission:  Current Outpatient Medications:    amLODipine (NORVASC) 10 MG tablet, Take 10 mg by mouth daily. (Patient not taking: Reported on 08/29/2021), Disp: , Rfl:    aspirin EC 81 MG tablet, Take 81 mg by mouth daily. Swallow whole., Disp: , Rfl:    calcium carbonate (TUMS - DOSED IN MG ELEMENTAL CALCIUM) 500 MG chewable tablet, Chew 1,000 mg by mouth daily as needed for indigestion or heartburn., Disp: , Rfl:    Coenzyme Q10-Vitamin E (QUNOL ULTRA COQ10 PO), Take 15 mLs by mouth daily., Disp: , Rfl:    fluticasone (FLONASE) 50 MCG/ACT nasal spray, Place 1 spray into both nostrils daily., Disp: , Rfl:    isosorbide mononitrate (IMDUR) 30 MG 24 hr tablet, Take 1 tablet by mouth once daily, Disp: 30 tablet, Rfl: 0   JARDIANCE 25 MG TABS tablet, Take 25 mg by mouth daily., Disp: , Rfl:    KERENDIA 10 MG TABS, Take 10 mg by mouth daily., Disp: , Rfl:    losartan (COZAAR) 25 MG tablet, Take 25 mg by mouth 2 (two) times daily., Disp: , Rfl:    METAMUCIL FIBER PO, Take 2 Scoops by mouth every evening., Disp: , Rfl:    metoprolol succinate (TOPROL-XL) 25 MG 24 hr tablet, Take 25 mg by mouth daily., Disp: , Rfl:    Multiple Vitamin (MULTIVITAMIN WITH MINERALS) TABS tablet, Take 1 tablet by mouth daily., Disp: , Rfl:    nitroGLYCERIN (NITROSTAT) 0.4 MG SL tablet, Place 0.4 mg under the tongue every 5 (five)  minutes as needed for chest pain., Disp: , Rfl:    ranolazine (RANEXA) 500 MG 12 hr tablet, Take 500 mg by mouth 2 (two) times daily., Disp: , Rfl:    rosuvastatin (CRESTOR) 40 MG tablet, Take 40 mg by mouth at bedtime., Disp: , Rfl:    SitaGLIPtin-MetFORMIN HCl (JANUMET XR) 50-1000 MG TB24, Take 1 tablet by mouth 2 (two) times daily., Disp: 30 tablet, Rfl:    ticagrelor (BRILINTA) 90 MG TABS tablet, Take 90 mg by mouth 2 (two) times daily., Disp: , Rfl:    Turmeric 500 MG CAPS, Take 500 mg by mouth daily., Disp: , Rfl:   Past Medical History: Past Medical History:  Diagnosis Date   Cancer ([place])    Coronary artery disease due to calcified coronary lesion    Diabetes mellitus without complication ([place])    Hyperlipidemia    Hypertension    S/P drug eluting coronary stent placement 11/25/2020   CTO at [facility] Dr Jac Canavan    Tobacco Use: Social History   Tobacco Use  Smoking Status Never  Smokeless Tobacco Never    Labs: Recent Review Flowsheet Data   There is no flowsheet data to display.     Capillary Blood Glucose: Lab Results  Component Value Date   GLUCAP 142 (H) 08/29/2021   GLUCAP 177 (H) 08/29/2021  GLUCAP 142 (H) 08/25/2021   GLUCAP 133 (H) 07/06/2020   GLUCAP 129 (H) 10/14/2010     Exercise Target Goals: Exercise Program Goal: Individual exercise prescription set using results from initial 6 min walk test and THRR while considering  patients activity barriers and safety.   Exercise Prescription Goal: Starting with aerobic activity 30 plus minutes a day, 3 days per week for initial exercise prescription. Provide home exercise prescription and guidelines that participant acknowledges understanding prior to discharge.  Activity Barriers & Risk Stratification:  Activity Barriers & Cardiac Risk Stratification - 08/25/21 1153       Activity Barriers & Cardiac Risk Stratification   Activity Barriers Deconditioning;Back Problems    Cardiac Risk  Stratification High             6 Minute Walk:  6 Minute Walk     Row Name 08/25/21 1150         6 Minute Walk   Phase Initial     Distance 1675 feet     Walk Time 6 minutes     # of Rest Breaks 0     MPH 3.17     METS 3.42     RPE 9     Perceived Dyspnea  0     VO2 Peak 11.95     Symptoms Yes (comment)     Comments Entry BP low. 90/47 after H2O sitting 91/49, stand 86/51. Pt was symptomatic, Slight dizzy. Later recheck 99/53 asymptomatic.     Resting HR 63 bpm     Resting BP 90/47     Resting Oxygen Saturation  97 %     Exercise Oxygen Saturation  during 6 min walk 99 %     Max Ex. HR 73 bpm     Max Ex. BP 128/57     2 Minute Post BP 108/56              Oxygen Initial Assessment:   Oxygen Re-Evaluation:   Oxygen Discharge (Final Oxygen Re-Evaluation):   Initial Exercise Prescription:  Initial Exercise Prescription - 08/25/21 1100       Date of Initial Exercise RX and Referring Provider   Date 08/25/21    Referring Provider Dr Loni Beckwith MD, [facility]/ Dr Fransico Him MD Covering    Expected Discharge Date 10/21/21      Treadmill   MPH 3.2    Grade 0    Minutes 15    METs 3.45      Bike   Level 1.2    Minutes 15    METs 2.5      Prescription Details   Frequency (times per week) 3    Duration Progress to 30 minutes of continuous aerobic without signs/symptoms of physical distress      Intensity   THRR 40-80% of Max Heartrate 58-115    Ratings of Perceived Exertion 11-13    Perceived Dyspnea 0-4      Progression   Progression Continue progressive overload as per policy without signs/symptoms or physical distress.      Resistance Training   Training Prescription Yes    Weight 5    Reps 10-15             Perform Capillary Blood Glucose checks as needed.  Exercise Prescription Changes:   Exercise Prescription Changes     Row Name 08/29/21 1022             Response to Exercise   Blood Pressure (  Admit) 102/60        Blood Pressure (Exercise) 132/58       Blood Pressure (Exit) 96/60       Heart Rate (Admit) 67 bpm       Heart Rate (Exercise) 90 bpm       Heart Rate (Exit) 75 bpm       Rating of Perceived Exertion (Exercise) 11       Symptoms None       Comments Off to a good start with exercise.       Duration Continue with 30 min of aerobic exercise without signs/symptoms of physical distress.       Intensity THRR unchanged         Progression   Progression Continue to progress workloads to maintain intensity without signs/symptoms of physical distress.       Average METs 3.7         Resistance Training   Training Prescription Yes       Weight 5       Reps 10-15       Time 10 Minutes         Interval Training   Interval Training No         Treadmill   MPH 3.2       Grade 0       Minutes 15       METs 3.45         Bike   Level 1.2       Minutes 15       METs 4.02                Exercise Comments:   Exercise Comments     Row Name 08/29/21 1133           Exercise Comments Patient tolerated mild-moderate intensity exercise well without symptoms.                Exercise Goals and Review:   Exercise Goals     Row Name 08/25/21 1157             Exercise Goals   Increase Physical Activity Yes       Intervention Provide advice, education, support and counseling about physical activity/exercise needs.;Develop an individualized exercise prescription for aerobic and resistive training based on initial evaluation findings, risk stratification, comorbidities and participant's personal goals.       Expected Outcomes Short Term: Attend rehab on a regular basis to increase amount of physical activity.;Long Term: Add in home exercise to make exercise part of routine and to increase amount of physical activity.;Long Term: Exercising regularly at least 3-5 days a week.       Increase Strength and Stamina Yes       Intervention Provide advice, education, support and  counseling about physical activity/exercise needs.;Develop an individualized exercise prescription for aerobic and resistive training based on initial evaluation findings, risk stratification, comorbidities and participant's personal goals.       Expected Outcomes Short Term: Increase workloads from initial exercise prescription for resistance, speed, and METs.;Short Term: Perform resistance training exercises routinely during rehab and add in resistance training at home;Long Term: Improve cardiorespiratory fitness, muscular endurance and strength as measured by increased METs and functional capacity (6MWT)       Able to understand and use rate of perceived exertion (RPE) scale Yes       Intervention Provide education and explanation on how to use RPE scale  Expected Outcomes Short Term: Able to use RPE daily in rehab to express subjective intensity level;Long Term:  Able to use RPE to guide intensity level when exercising independently       Knowledge and understanding of Target Heart Rate Range (THRR) Yes       Intervention Provide education and explanation of THRR including how the numbers were predicted and where they are located for reference       Expected Outcomes Short Term: Able to state/look up THRR;Long Term: Able to use THRR to govern intensity when exercising independently;Short Term: Able to use daily as guideline for intensity in rehab       Understanding of Exercise Prescription Yes       Intervention Provide education, explanation, and written materials on patient's individual exercise prescription       Expected Outcomes Short Term: Able to explain program exercise prescription;Long Term: Able to explain home exercise prescription to exercise independently                Exercise Goals Re-Evaluation :  Exercise Goals Re-Evaluation     [place] Name 08/29/21 1133             Exercise Goal Re-Evaluation   Exercise Goals Review Increase Physical Activity;Able to understand  and use rate of perceived exertion (RPE) scale       Comments Patient able to understand and use RPE scale appropriately.       Expected Outcomes Progress workloads as tolerated to help inrease strength and stamina.                 Discharge Exercise Prescription (Final Exercise Prescription Changes):  Exercise Prescription Changes - 08/29/21 1022       Response to Exercise   Blood Pressure (Admit) 102/60    Blood Pressure (Exercise) 132/58    Blood Pressure (Exit) 96/60    Heart Rate (Admit) 67 bpm    Heart Rate (Exercise) 90 bpm    Heart Rate (Exit) 75 bpm    Rating of Perceived Exertion (Exercise) 11    Symptoms None    Comments Off to a good start with exercise.    Duration Continue with 30 min of aerobic exercise without signs/symptoms of physical distress.    Intensity THRR unchanged      Progression   Progression Continue to progress workloads to maintain intensity without signs/symptoms of physical distress.    Average METs 3.7      Resistance Training   Training Prescription Yes    Weight 5    Reps 10-15    Time 10 Minutes      Interval Training   Interval Training No      Treadmill   MPH 3.2    Grade 0    Minutes 15    METs 3.45      Bike   Level 1.2    Minutes 15    METs 4.02             Nutrition:  Target Goals: Understanding of nutrition guidelines, daily intake of sodium 1500mg , cholesterol 200mg , calories 30% from fat and 7% or less from saturated fats, daily to have 5 or more servings of fruits and vegetables.  Biometrics:  Pre Biometrics - 08/25/21 1149       Pre Biometrics   Waist Circumference 34 inches    Hip Circumference 35.75 inches    Waist to Hip Ratio 0.95 %    Triceps Skinfold 4 mm    %  Body Fat 17.4 %    Grip Strength 29 kg    Flexibility 9 in    Single Leg Stand 19.7 seconds              Nutrition Therapy Plan and Nutrition Goals:   Nutrition Assessments:  MEDIFICTS Score Key: ?70 Need to make  dietary changes  40-70 Heart Healthy Diet ? 40 Therapeutic Level Cholesterol Diet   Picture Your Plate Scores: <34 Unhealthy dietary pattern with much room for improvement. 41-50 Dietary pattern unlikely to meet recommendations for good health and room for improvement. 51-60 More healthful dietary pattern, with some room for improvement.  >60 Healthy dietary pattern, although there may be some specific behaviors that could be improved.    Nutrition Goals Re-Evaluation:   Nutrition Goals Discharge (Final Nutrition Goals Re-Evaluation):   Psychosocial: Target Goals: Acknowledge presence or absence of significant depression and/or stress, maximize coping skills, provide positive support system. Participant is able to verbalize types and ability to use techniques and skills needed for reducing stress and depression.  Initial Review & Psychosocial Screening:  Initial Psych Review & Screening - 08/25/21 1344       Initial Review   Current issues with Current Stress Concerns    Source of Stress Concerns Chronic Illness    Comments Waunita Schooner is concerned about his health as he has lost 20 pounds in the past few months      [place]? Yes   Waunita Schooner has his wife and son who live in the area for support     Barriers   Psychosocial barriers to participate in program The patient should benefit from training in stress management and relaxation.      Screening Interventions   Interventions Encouraged to exercise             Quality of Life Scores:  Quality of Life - 08/25/21 1159       Quality of Life   Select Quality of Life      Quality of Life Scores   Health/Function Pre 27.2 %    Socioeconomic Pre 27.21 %    Psych/Spiritual Pre 30 %    Family Pre 27.6 %    GLOBAL Pre 27.84 %            Scores of 19 and below usually indicate a poorer quality of life in these areas.  A difference of  2-3 points is a clinically meaningful difference.  A  difference of 2-3 points in the total score of the Quality of Life Index has been associated with significant improvement in overall quality of life, self-image, physical symptoms, and general health in studies assessing change in quality of life.  PHQ-9: Recent Review Flowsheet Data     Depression screen [facility] 2/9 08/25/2021   Decreased Interest 0   Down, Depressed, Hopeless 0   PHQ - 2 Score 0      Interpretation of Total Score  Total Score Depression Severity:  1-4 = Minimal depression, 5-9 = Mild depression, 10-14 = Moderate depression, 15-19 = Moderately severe depression, 20-27 = Severe depression   Psychosocial Evaluation and Intervention:   Psychosocial Re-Evaluation:  Psychosocial Re-Evaluation     [place] Name 08/29/21 1713             Psychosocial Re-Evaluation   Current issues with Current Stress Concerns       Comments Waunita Schooner did not vouce any increased stressors on his first day of exercise  Expected Outcomes Waunita Schooner will have decreased stress upon completion of phase 2 cardiac rehab       Interventions Stress management education;Encouraged to attend Cardiac Rehabilitation for the exercise;Relaxation education       Continue Psychosocial Services  Follow up required by staff         Initial Review   Source of Stress Concerns Chronic Illness       Comments Will continue to monitor and offer support as needed                Psychosocial Discharge (Final Psychosocial Re-Evaluation):  Psychosocial Re-Evaluation - 08/29/21 1713       Psychosocial Re-Evaluation   Current issues with Current Stress Concerns    Comments Waunita Schooner did not vouce any increased stressors on his first day of exercise    Expected Outcomes Waunita Schooner will have decreased stress upon completion of phase 2 cardiac rehab    Interventions Stress management education;Encouraged to attend Cardiac Rehabilitation for the exercise;Relaxation education    Continue Psychosocial Services  Follow up required by  staff      Initial Review   Source of Stress Concerns Chronic Illness    Comments Will continue to monitor and offer support as needed             Vocational Rehabilitation: Provide vocational rehab assistance to qualifying candidates.   Vocational Rehab Evaluation & Intervention:  Vocational Rehab - 08/25/21 1346       Initial Vocational Rehab Evaluation & Intervention   Assessment shows need for Vocational Rehabilitation No   Waunita Schooner is retired and does not need vocational rehab at this time            Education: Education Goals: Education classes will be provided on a weekly basis, covering required topics. Participant will state understanding/return demonstration of topics presented.  Learning Barriers/Preferences:  Learning Barriers/Preferences - 08/25/21 1159       Learning Barriers/Preferences   Learning Barriers Hearing;Exercise Concerns;Sight   wears glasses, HOH and occasionally dizzy   Learning Preferences Group Instruction;Individual Instruction;Skilled Demonstration;Verbal Instruction             Education Topics: Hypertension, Hypertension Reduction -Define heart disease and high blood pressure. Discus how high blood pressure affects the body and ways to reduce high blood pressure.   Exercise and Your Heart -Discuss why it is important to exercise, the FITT principles of exercise, normal and abnormal responses to exercise, and how to exercise safely.   Angina -Discuss definition of angina, causes of angina, treatment of angina, and how to decrease risk of having angina.   Cardiac Medications -Review what the following cardiac medications are used for, how they affect the body, and side effects that may occur when taking the medications.  Medications include Aspirin, Beta blockers, calcium channel blockers, ACE Inhibitors, angiotensin receptor blockers, diuretics, digoxin, and antihyperlipidemics.   Congestive Heart Failure -Discuss the  definition of CHF, how to live with CHF, the signs and symptoms of CHF, and how keep track of weight and sodium intake.   Heart Disease and Intimacy -Discus the effect sexual activity has on the heart, how changes occur during intimacy as we age, and safety during sexual activity.   Smoking Cessation / COPD -Discuss different methods to quit smoking, the health benefits of quitting smoking, and the definition of COPD.   Nutrition I: Fats -Discuss the types of cholesterol, what cholesterol does to the heart, and how cholesterol levels can be controlled.   Nutrition II:  Labels -Discuss the different components of food labels and how to read food label   Heart Parts/Heart Disease and PAD -Discuss the anatomy of the heart, the pathway of blood circulation through the heart, and these are affected by heart disease.   Stress I: Signs and Symptoms -Discuss the causes of stress, how stress may lead to anxiety and depression, and ways to limit stress.   Stress II: Relaxation -Discuss different types of relaxation techniques to limit stress.   Warning Signs of Stroke / TIA -Discuss definition of a stroke, what the signs and symptoms are of a stroke, and how to identify when someone is having stroke.   Knowledge Questionnaire Score:  Knowledge Questionnaire Score - 08/25/21 1201       Knowledge Questionnaire Score   Pre Score 19/24             Core Components/Risk Factors/Patient Goals at Admission:  Personal Goals and Risk Factors at Admission - 08/25/21 1202       Core Components/Risk Factors/Patient Goals on Admission    Weight Management Yes;Weight Maintenance   Lost weight, but feels weak and wants muscle strength   Intervention Weight Management: Develop a combined nutrition and exercise program designed to reach desired caloric intake, while maintaining appropriate intake of nutrient and fiber, sodium and fats, and appropriate energy expenditure required for the  weight goal.;Weight Management: Provide education and appropriate resources to help participant work on and attain dietary goals.    Admit Weight 165 lb 5.5 oz (75 kg)    Expected Outcomes Weight Maintenance: Understanding of the daily nutrition guidelines, which includes 25-35% calories from fat, 7% or less cal from saturated fats, less than 200mg  cholesterol, less than 1.5gm of sodium, & 5 or more servings of fruits and vegetables daily;Short Term: Continue to assess and modify interventions until short term weight is achieved;Long Term: Adherence to nutrition and physical activity/exercise program aimed toward attainment of established weight goal;Weight Gain: Understanding of general recommendations for a high calorie, high protein meal plan that promotes weight gain by distributing calorie intake throughout the day with the consumption for 4-5 meals, snacks, and/or supplements;Understanding of distribution of calorie intake throughout the day with the consumption of 4-5 meals/snacks;Understanding recommendations for meals to include 15-35% energy as protein, 25-35% energy from fat, 35-60% energy from carbohydrates, less than 200mg  of dietary cholesterol, 20-35 gm of total fiber daily    Diabetes Yes    Intervention Provide education about signs/symptoms and action to take for hypo/hyperglycemia.;Provide education about proper nutrition, including hydration, and aerobic/resistive exercise prescription along with prescribed medications to achieve blood glucose in normal ranges: Fasting glucose 65-99 mg/dL    Expected Outcomes Short Term: Participant verbalizes understanding of the signs/symptoms and immediate care of hyper/hypoglycemia, proper foot care and importance of medication, aerobic/resistive exercise and nutrition plan for blood glucose control.;Long Term: Attainment of HbA1C < 7%.    Hypertension Yes    Intervention Provide education on lifestyle modifcations including regular physical  activity/exercise, weight management, moderate sodium restriction and increased consumption of fresh fruit, vegetables, and low fat dairy, alcohol moderation, and smoking cessation.;Monitor prescription use compliance.    Expected Outcomes Short Term: Continued assessment and intervention until BP is < 140/42mm HG in hypertensive participants. < 130/54mm HG in hypertensive participants with diabetes, heart failure or chronic kidney disease.;Long Term: Maintenance of blood pressure at goal levels.    Lipids Yes    Intervention Provide education and support for participant on nutrition & aerobic/resistive exercise  along with prescribed medications to achieve LDL 70mg , HDL >40mg .    Expected Outcomes Short Term: Participant states understanding of desired cholesterol values and is compliant with medications prescribed. Participant is following exercise prescription and nutrition guidelines.;Long Term: Cholesterol controlled with medications as prescribed, with individualized exercise RX and with personalized nutrition plan. Value goals: LDL < 70mg , HDL > 40 mg.    Stress Yes    Intervention Offer individual and/or small group education and counseling on adjustment to heart disease, stress management and health-related lifestyle change. Teach and support self-help strategies.;Refer participants experiencing significant psychosocial distress to appropriate mental health specialists for further evaluation and treatment. When possible, include family members and significant others in education/counseling sessions.    Expected Outcomes Short Term: Participant demonstrates changes in health-related behavior, relaxation and other stress management skills, ability to obtain effective social support, and compliance with psychotropic medications if prescribed.;Long Term: Emotional wellbeing is indicated by absence of clinically significant psychosocial distress or social isolation.    Personal Goal Other Yes     Personal Goal Long and short the same: build strength, make lifestyle changes and change diet    Intervention Will continue to monitor pt and progress workloads as tolerated without sign or symptom    Expected Outcomes Pt will achieve his goals and gain strength and follow a heart healthy and DM managed lifestyle             Core Components/Risk Factors/Patient Goals Review:   Goals and Risk Factor Review     Row Name 08/29/21 1717             Core Components/Risk Factors/Patient Goals Review   Personal Goals Review Weight Management/Obesity;Hypertension;Stress;Lipids;Diabetes       Review Waunita Schooner started cardiac rehab on 08/29/21. Vital signs and CBG's were stable. Amlodipine was stopped by Dr Elonda Husky       Expected Outcomes Waunita Schooner will continue to participate in phase 2 cardiac rehab for exercise, nutrition and lifestyle modifications                Core Components/Risk Factors/Patient Goals at Discharge (Final Review):   Goals and Risk Factor Review - 08/29/21 1717       Core Components/Risk Factors/Patient Goals Review   Personal Goals Review Weight Management/Obesity;Hypertension;Stress;Lipids;Diabetes    Review Waunita Schooner started cardiac rehab on 08/29/21. Vital signs and CBG's were stable. Amlodipine was stopped by Dr Elonda Husky    Expected Outcomes Waunita Schooner will continue to participate in phase 2 cardiac rehab for exercise, nutrition and lifestyle modifications             ITP Comments:  ITP Comments     Row Name 08/25/21 1145 08/30/21 1441         ITP Comments Dr Fransico Him MD, Medical Director 30 Day ITP Reivew. Waunita Schooner started cardiac rehab on 08/29/21. Waunita Schooner did well with exercise.               Comments: See ITP comments.Harrell Gave RN BSN

## 2021-08-31 ENCOUNTER — Encounter (HOSPITAL_COMMUNITY)
Admission: RE | Admit: 2021-08-31 | Discharge: 2021-08-31 | Disposition: A | Discharge status: Home / Self Care | Payer: PPO | Source: Ambulatory Visit | Attending: Cardiology | Admitting: Cardiology

## 2021-08-31 ENCOUNTER — Other Ambulatory Visit: Payer: Self-pay

## 2021-08-31 DIAGNOSIS — Z955 Presence of coronary angioplasty implant and graft: Secondary | ICD-10-CM

## 2021-08-31 LAB — GLUCOSE, CAPILLARY
Glucose-Capillary: 149 mg/dL — ABNORMAL HIGH (ref 70–99)
Glucose-Capillary: 109 mg/dL — ABNORMAL HIGH (ref 70–99)

## 2021-09-02 ENCOUNTER — Other Ambulatory Visit: Payer: Self-pay

## 2021-09-02 ENCOUNTER — Encounter (HOSPITAL_COMMUNITY)
Admission: RE | Admit: 2021-09-02 | Discharge: 2021-09-02 | Disposition: A | Discharge status: Home / Self Care | Payer: PPO | Source: Ambulatory Visit | Attending: Cardiology | Admitting: Cardiology

## 2021-09-02 DIAGNOSIS — Z955 Presence of coronary angioplasty implant and graft: Secondary | ICD-10-CM

## 2021-09-05 ENCOUNTER — Other Ambulatory Visit: Payer: Self-pay

## 2021-09-05 ENCOUNTER — Encounter (HOSPITAL_COMMUNITY)
Admission: RE | Admit: 2021-09-05 | Discharge: 2021-09-05 | Disposition: A | Discharge status: Home / Self Care | Payer: PPO | Source: Ambulatory Visit | Attending: Cardiology | Admitting: Cardiology

## 2021-09-05 DIAGNOSIS — Z955 Presence of coronary angioplasty implant and graft: Secondary | ICD-10-CM

## 2021-09-07 ENCOUNTER — Encounter (HOSPITAL_COMMUNITY)
Admission: RE | Admit: 2021-09-07 | Discharge: 2021-09-07 | Disposition: A | Discharge status: Home / Self Care | Payer: PPO | Source: Ambulatory Visit | Attending: Cardiology | Admitting: Cardiology

## 2021-09-07 ENCOUNTER — Other Ambulatory Visit: Payer: Self-pay

## 2021-09-07 DIAGNOSIS — Z955 Presence of coronary angioplasty implant and graft: Secondary | ICD-10-CM | POA: Diagnosis not present

## 2021-09-09 ENCOUNTER — Encounter (HOSPITAL_COMMUNITY)
Admission: RE | Admit: 2021-09-09 | Discharge: 2021-09-09 | Disposition: A | Discharge status: Home / Self Care | Payer: PPO | Source: Ambulatory Visit | Attending: Cardiology | Admitting: Cardiology

## 2021-09-09 ENCOUNTER — Other Ambulatory Visit: Payer: Self-pay

## 2021-09-09 DIAGNOSIS — Z955 Presence of coronary angioplasty implant and graft: Secondary | ICD-10-CM

## 2021-09-12 ENCOUNTER — Encounter (HOSPITAL_COMMUNITY): Payer: PPO

## 2021-09-12 DIAGNOSIS — Z955 Presence of coronary angioplasty implant and graft: Secondary | ICD-10-CM | POA: Diagnosis not present

## 2021-09-12 DIAGNOSIS — I255 Ischemic cardiomyopathy: Secondary | ICD-10-CM | POA: Diagnosis not present

## 2021-09-12 DIAGNOSIS — I2582 Chronic total occlusion of coronary artery: Secondary | ICD-10-CM | POA: Diagnosis not present

## 2021-09-12 DIAGNOSIS — I25110 Atherosclerotic heart disease of native coronary artery with unstable angina pectoris: Secondary | ICD-10-CM | POA: Diagnosis not present

## 2021-09-14 ENCOUNTER — Encounter (HOSPITAL_COMMUNITY): Payer: PPO

## 2021-09-14 ENCOUNTER — Telehealth (HOSPITAL_COMMUNITY): Payer: Self-pay | Admitting: Internal Medicine

## 2021-09-16 ENCOUNTER — Other Ambulatory Visit: Payer: Self-pay

## 2021-09-16 ENCOUNTER — Encounter (HOSPITAL_COMMUNITY)
Admission: RE | Admit: 2021-09-16 | Discharge: 2021-09-16 | Disposition: A | Discharge status: Home / Self Care | Payer: PPO | Source: Ambulatory Visit | Attending: Cardiology | Admitting: Cardiology

## 2021-09-16 DIAGNOSIS — Z955 Presence of coronary angioplasty implant and graft: Secondary | ICD-10-CM | POA: Diagnosis not present

## 2021-09-16 NOTE — Progress Notes (Signed)
Reviewed home exercise guidelines with patient including endpoints, temperature precautions, target heart rate and rate of perceived exertion. Patient is currently walking the treadmill 15 minutes, riding the stationary bike 15 minutes, and weight training at [employer] three days/week as his mode of home exercise. Reviewed pulse counting with patient. Patient voices understanding of instructions given. ? ?Logan Passer, MS, ACSM CEP ? ? ?

## 2021-09-19 ENCOUNTER — Other Ambulatory Visit: Payer: Self-pay

## 2021-09-19 ENCOUNTER — Encounter (HOSPITAL_COMMUNITY)
Admission: RE | Admit: 2021-09-19 | Discharge: 2021-09-19 | Disposition: A | Discharge status: Home / Self Care | Payer: PPO | Source: Ambulatory Visit | Attending: Cardiology | Admitting: Cardiology

## 2021-09-19 DIAGNOSIS — Z955 Presence of coronary angioplasty implant and graft: Secondary | ICD-10-CM

## 2021-09-21 ENCOUNTER — Other Ambulatory Visit: Payer: Self-pay

## 2021-09-21 ENCOUNTER — Encounter (HOSPITAL_COMMUNITY)
Admission: RE | Admit: 2021-09-21 | Discharge: 2021-09-21 | Disposition: A | Discharge status: Home / Self Care | Payer: PPO | Source: Ambulatory Visit | Attending: Cardiology | Admitting: Cardiology

## 2021-09-21 DIAGNOSIS — Z955 Presence of coronary angioplasty implant and graft: Secondary | ICD-10-CM

## 2021-09-23 ENCOUNTER — Encounter (HOSPITAL_COMMUNITY)
Admission: RE | Admit: 2021-09-23 | Discharge: 2021-09-23 | Disposition: A | Discharge status: Home / Self Care | Payer: PPO | Source: Ambulatory Visit | Attending: Cardiology | Admitting: Cardiology

## 2021-09-23 ENCOUNTER — Other Ambulatory Visit: Payer: Self-pay

## 2021-09-23 DIAGNOSIS — Z955 Presence of coronary angioplasty implant and graft: Secondary | ICD-10-CM | POA: Diagnosis not present

## 2021-09-26 ENCOUNTER — Other Ambulatory Visit: Payer: Self-pay

## 2021-09-26 ENCOUNTER — Encounter (HOSPITAL_COMMUNITY)
Admission: RE | Admit: 2021-09-26 | Discharge: 2021-09-26 | Disposition: A | Discharge status: Home / Self Care | Payer: PPO | Source: Ambulatory Visit | Attending: Cardiology | Admitting: Cardiology

## 2021-09-26 DIAGNOSIS — Z955 Presence of coronary angioplasty implant and graft: Secondary | ICD-10-CM | POA: Diagnosis not present

## 2021-09-27 NOTE — Progress Notes (Signed)
Cardiac Individual Treatment Plan ? ?Patient Details  ?Name: Logan Gonzales ?MRN: [id number] ?Date of Birth: 1946-02-12 ?Referring Provider:   ?Flowsheet Row CARDIAC REHAB PHASE II ORIENTATION from 08/25/2021 in [place]  ?Referring Provider Dr Loni Beckwith MD, [facility]/ Dr Fransico Him MD Covering  ? ?  ? ? ?Initial Encounter Date:  ?Flowsheet Row CARDIAC REHAB PHASE II ORIENTATION from 08/25/2021 in [place]  ?Date 08/25/21  ? ?  ? ? ?Visit Diagnosis: 11/25/20 S/P CTO DES LAD at [facility] Dr Jac Canavan ? ?Patient's Home Medications on Admission: ? ?Current Outpatient Medications:  ?  amLODipine (NORVASC) 10 MG tablet, Take 10 mg by mouth daily. (Patient not taking: Reported on 08/29/2021), Disp: , Rfl:  ?  aspirin EC 81 MG tablet, Take 81 mg by mouth daily. Swallow whole., Disp: , Rfl:  ?  calcium carbonate (TUMS - DOSED IN MG ELEMENTAL CALCIUM) 500 MG chewable tablet, Chew 1,000 mg by mouth daily as needed for indigestion or heartburn., Disp: , Rfl:  ?  Coenzyme Q10-Vitamin E (QUNOL ULTRA COQ10 PO), Take 15 mLs by mouth daily., Disp: , Rfl:  ?  fluticasone (FLONASE) 50 MCG/ACT nasal spray, Place 1 spray into both nostrils daily., Disp: , Rfl:  ?  isosorbide mononitrate (IMDUR) 30 MG 24 hr tablet, Take 1 tablet by mouth once daily, Disp: 30 tablet, Rfl: 0 ?  JARDIANCE 25 MG TABS tablet, Take 25 mg by mouth daily., Disp: , Rfl:  ?  KERENDIA 10 MG TABS, Take 10 mg by mouth daily., Disp: , Rfl:  ?  losartan (COZAAR) 25 MG tablet, Take 25 mg by mouth 2 (two) times daily., Disp: , Rfl:  ?  METAMUCIL FIBER PO, Take 2 Scoops by mouth every evening., Disp: , Rfl:  ?  metoprolol succinate (TOPROL-XL) 25 MG 24 hr tablet, Take 25 mg by mouth daily., Disp: , Rfl:  ?  Multiple Vitamin (MULTIVITAMIN WITH MINERALS) TABS tablet, Take 1 tablet by mouth daily., Disp: , Rfl:  ?  nitroGLYCERIN (NITROSTAT) 0.4 MG SL tablet, Place 0.4 mg under the tongue every 5 (five)  minutes as needed for chest pain., Disp: , Rfl:  ?  ranolazine (RANEXA) 500 MG 12 hr tablet, Take 500 mg by mouth 2 (two) times daily., Disp: , Rfl:  ?  rosuvastatin (CRESTOR) 40 MG tablet, Take 40 mg by mouth at bedtime., Disp: , Rfl:  ?  SitaGLIPtin-MetFORMIN HCl (JANUMET XR) 50-1000 MG TB24, Take 1 tablet by mouth 2 (two) times daily., Disp: 30 tablet, Rfl:  ?  ticagrelor (BRILINTA) 90 MG TABS tablet, Take 90 mg by mouth 2 (two) times daily., Disp: , Rfl:  ?  Turmeric 500 MG CAPS, Take 500 mg by mouth daily., Disp: , Rfl:  ? ?Past Medical History: ?Past Medical History:  ?Diagnosis Date  ? Cancer [facility])   ? Coronary artery disease due to calcified coronary lesion   ? Diabetes mellitus without complication ([place])   ? Hyperlipidemia   ? Hypertension   ? S/P drug eluting coronary stent placement 11/25/2020  ? CTO at [facility] Dr Jac Canavan  ? ? ?Tobacco Use: ?Social History  ? ?Tobacco Use  ?Smoking Status Never  ?Smokeless Tobacco Never  ? ? ?Labs: ?Recent Review Flowsheet Data   ?There is no flowsheet data to display. ?  ? ? ?Capillary Blood Glucose: ?Lab Results  ?Component Value Date  ? GLUCAP 109 (H) 08/31/2021  ? GLUCAP 149 (H) 08/31/2021  ?  GLUCAP 142 (H) 08/29/2021  ? GLUCAP 177 (H) 08/29/2021  ? GLUCAP 142 (H) 08/25/2021  ? ? ? ?Exercise Target Goals: ?Exercise Program Goal: ?Individual exercise prescription set using results from initial 6 min walk test and THRR while considering  patient?s activity barriers and safety.  ? ?Exercise Prescription Goal: ?Initial exercise prescription builds to 30-45 minutes a day of aerobic activity, 2-3 days per week.  Home exercise guidelines will be given to patient during program as part of exercise prescription that the participant will acknowledge. ? ?Activity Barriers & Risk Stratification: ? Activity Barriers & Cardiac Risk Stratification - 08/25/21 1153   ? ?  ? Activity Barriers & Cardiac Risk Stratification  ? Activity Barriers Deconditioning;Back Problems   ?  Cardiac Risk Stratification High   ? ?  ?  ? ?  ? ? ?6 Minute Walk: ? 6 Minute Walk   ? ? [place] Name 08/25/21 1150  ?  ?  ?  ? 6 Minute Walk  ? Phase Initial    ? Distance 1675 feet    ? Walk Time 6 minutes    ? # of Rest Breaks 0    ? MPH 3.17    ? METS 3.42    ? RPE 9    ? Perceived Dyspnea  0    ? VO2 Peak 11.95    ? Symptoms Yes (comment)    ? Comments Entry BP low. 90/47 after H2O sitting 91/49, stand 86/51. Pt was symptomatic, Slight dizzy. Later recheck 99/53 asymptomatic.    ? Resting HR 63 bpm    ? Resting BP 90/47    ? Resting Oxygen Saturation  97 %    ? Exercise Oxygen Saturation  during 6 min walk 99 %    ? Max Ex. HR 73 bpm    ? Max Ex. BP 128/57    ? 2 Minute Post BP 108/56    ? ?  ?  ? ?  ? ? ?Oxygen Initial Assessment: ? ? ?Oxygen Re-Evaluation: ? ? ?Oxygen Discharge (Final Oxygen Re-Evaluation): ? ? ?Initial Exercise Prescription: ? Initial Exercise Prescription - 08/25/21 1100   ? ?  ? Date of Initial Exercise RX and Referring Provider  ? Date 08/25/21   ? Referring Provider Dr Loni Beckwith MD, [facility]/ Dr Fransico Him MD Covering   ? Expected Discharge Date 10/21/21   ?  ? Treadmill  ? MPH 3.2   ? Grade 0   ? Minutes 15   ? METs 3.45   ?  ? Bike  ? Level 1.2   ? Minutes 15   ? METs 2.5   ?  ? Prescription Details  ? Frequency (times per week) 3   ? Duration Progress to 30 minutes of continuous aerobic without signs/symptoms of physical distress   ?  ? Intensity  ? THRR 40-80% of Max Heartrate 58-115   ? Ratings of Perceived Exertion 11-13   ? Perceived Dyspnea 0-4   ?  ? Progression  ? Progression Continue progressive overload as per policy without signs/symptoms or physical distress.   ?  ? Resistance Training  ? Training Prescription Yes   ? Weight 5   ? Reps 10-15   ? ?  ?  ? ?  ? ? ?Perform Capillary Blood Glucose checks as needed. ? ?Exercise Prescription Changes: ? ? Exercise Prescription Changes   ? ? [place] Name 08/29/21 1022 09/16/21 1022 09/26/21 1019  ?  ?  ?  ?  Response to Exercise   ? Blood Pressure (Admit) 102/60 102/54 114/68    ? Blood Pressure (Exercise) 132/58 126/82 142/68    ? Blood Pressure (Exit) 96/60 108/62 102/68    ? Heart Rate (Admit) 67 bpm 56 bpm 54 bpm    ? Heart Rate (Exercise) 90 bpm 93 bpm 86 bpm    ? Heart Rate (Exit) 75 bpm 63 bpm 62 bpm    ? Rating of Perceived Exertion (Exercise) '11 13 13    '$ ? Symptoms None None None    ? Comments Off to a good start with exercise. -- --    ? Duration Continue with 30 min of aerobic exercise without signs/symptoms of physical distress. Continue with 30 min of aerobic exercise without signs/symptoms of physical distress. Continue with 30 min of aerobic exercise without signs/symptoms of physical distress.    ? Intensity THRR unchanged THRR unchanged THRR unchanged    ?  ? Progression  ? Progression Continue to progress workloads to maintain intensity without signs/symptoms of physical distress. Continue to progress workloads to maintain intensity without signs/symptoms of physical distress. Continue to progress workloads to maintain intensity without signs/symptoms of physical distress.    ? Average METs 3.7 3.9 3.8    ?  ? Resistance Training  ? Training Prescription Yes Yes Yes    ? Weight '5 5 5    '$ ? Reps 10-15 10-15 10-15    ? Time 10 Minutes 10 Minutes 10 Minutes    ?  ? Interval Training  ? Interval Training No No No    ?  ? Treadmill  ? MPH 3.2 3.3 3.4    ? Grade 0 0 0    ? Minutes '15 15 15    '$ ? METs 3.45 3.52 3.6    ?  ? Bike  ? Level 1.2 1.3 1.2    ? Minutes '15 15 15    '$ ? METs 4.02 4.31 4.06    ?  ? Home Exercise Plan  ? Plans to continue exercise at -- [facility] (comment) [facility] ([facility])    ? Frequency -- Add 3 additional days to program exercise sessions. Add 3 additional days to program exercise sessions.    ? Initial Home Exercises Provided -- 09/16/21 09/16/21    ? ?  ?  ? ?  ? ? ?Exercise Comments: ? ? Exercise Comments   ? ? [place] Name 08/29/21 1133 09/16/21 1057 09/26/21 1047  ?  ?  ? Exercise Comments  Patient tolerated mild-moderate intensity exercise well without symptoms. Reviewed home exercise guidelines, METs, and goals with patient. Reviewed METs with patient.    ? ?  ?  ? ?  ? ? ?Exercise Goals an

## 2021-09-28 ENCOUNTER — Ambulatory Visit: Payer: PPO | Attending: Internal Medicine

## 2021-09-28 ENCOUNTER — Encounter (HOSPITAL_COMMUNITY)
Admission: RE | Admit: 2021-09-28 | Discharge: 2021-09-28 | Disposition: A | Discharge status: Home / Self Care | Payer: PPO | Source: Ambulatory Visit | Attending: Cardiology | Admitting: Cardiology

## 2021-09-28 ENCOUNTER — Other Ambulatory Visit: Payer: Self-pay

## 2021-09-28 VITALS — BP 106/62 | HR 66

## 2021-09-28 DIAGNOSIS — R2681 Unsteadiness on feet: Secondary | ICD-10-CM | POA: Diagnosis not present

## 2021-09-28 DIAGNOSIS — Z955 Presence of coronary angioplasty implant and graft: Secondary | ICD-10-CM

## 2021-09-28 DIAGNOSIS — M6281 Muscle weakness (generalized): Secondary | ICD-10-CM | POA: Diagnosis not present

## 2021-09-28 DIAGNOSIS — R2689 Other abnormalities of gait and mobility: Secondary | ICD-10-CM | POA: Insufficient documentation

## 2021-09-28 NOTE — Therapy (Signed)
?OUTPATIENT PHYSICAL THERAPY VESTIBULAR EVALUATION ? ? ? ? ?Patient Name: Logan Gonzales ?MRN: [id number] ?DOB:Jan 02, 1946, 76 y.o., male ?Today's Date: 09/28/2021 ? ?PCP: Deland Pretty, MD ?REFERRING PROVIDER: Deland Pretty, MD ? ?Vitals:  ? 09/28/21 1448  ?BP: 106/62  ?Pulse: 66  ? ? PT End of Session - 09/28/21 1432   ? ? Visit Number 1   ? Number of Visits 7   ? Date for PT Re-Evaluation 12/02/21   ? Authorization Type [employer]   ? Progress Note Due on Visit 10   ? PT Start Time 1432   ? PT Stop Time 1520   ? PT Time Calculation (min) 48 min   ? Activity Tolerance Patient tolerated treatment well   ? Behavior During Therapy [place] for tasks assessed/performed   ? ?  ?  ? ?  ? ? ?Past Medical History:  ?Diagnosis Date  ? Cancer [facility])   ? Coronary artery disease due to calcified coronary lesion   ? Diabetes mellitus without complication ([place])   ? Hyperlipidemia   ? Hypertension   ? S/P drug eluting coronary stent placement 11/25/2020  ? CTO at [facility] Dr Jac Canavan  ? ?Past Surgical History:  ?Procedure Laterality Date  ? BLADDER SURGERY    ? CARDIAC CATHETERIZATION    ? HERNIA REPAIR    ? LEFT HEART CATH AND CORONARY ANGIOGRAPHY N/A 07/06/2020  ? Procedure: LEFT HEART CATH AND CORONARY ANGIOGRAPHY;  Surgeon: Adrian Prows, MD;  Location: [place] CV LAB;  Service: Cardiovascular;  Laterality: N/A;  ? SHOULDER SURGERY Right   ? ?Patient Active Problem List  ? Diagnosis Date Noted  ? Dyspnea on exertion 07/05/2020  ? CAD in native artery 07/05/2020  ? DM (diabetes mellitus) ([place]) 07/21/2012  ? ? ?ONSET DATE: 09/08/2021 (Referral Date)  ? ?REFERRING DIAG: R26.89 (ICD-10-CM) - Balance disorder ? ?THERAPY DIAG:  ?Unsteadiness on feet ? ?Muscle weakness (generalized) ? ?Other abnormalities of gait and mobility ? ?SUBJECTIVE:  ? ?SUBJECTIVE STATEMENT: ?Patient reports that he has a big hobby of [employer], and started to noticed the balance issues while doing this. Reports he feels unsteady hiking to the fly fishing to  the area as well as when wading in the water. Currently is completing cardiac rehab after the heart procedure. Days he does not get rehab, he goes to the gym. Notices that the balance was off after he had heart procedure. Denies dizziness. Denies falls. Patient reports he has purchased a walking stick to use while he out. Reports he is very cautious.  ? ?Pt accompanied by: self ? ?PERTINENT HISTORY: Cancer, CAD, DM, HLD, HTN ? ? ?PAIN:  ?Are you having pain? No ? ?PRECAUTIONS: Fall ? ?WEIGHT BEARING RESTRICTIONS No ? ?FALLS: Has patient fallen in last 6 months? No,  ? ?LIVING ENVIRONMENT: ?Lives with: lives with their spouse ?Lives in: House/apartment ?Stairs: Yes; Internal: 15 steps; can reach both and External: 8 steps; can reach both ?Has following equipment at home:  trekking pole ? 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 ?PATIENT GOALS Improve the Balance ? ?OBJECTIVE:  ? ?COGNITION: ?Overall cognitive status: Within functional limits for tasks assessed ?  ?SENSATION: ?WFL ? ?POSTURE: No Significant postural limitations ? ? ?MMT:  ? ?MMT Right ?09/28/2021 Left ?09/28/2021  ?Hip flexion 4+/5 4+/5  ?Hip abduction 4+/5 4+/5  ?Hip adduction    ?Hip internal rotation    ?Hip external rotation    ?Knee flexion 4+/5 4+/5  ?Knee extension 4+/5  4+/5  ?Ankle dorsiflexion    ?Ankle plantarflexion    ?Ankle inversion    ?Ankle eversion    ?(Blank rows = not tested) ? ? ?TRANSFERS: ?Assistive device utilized: None  ?Sit to stand: SBA ?Stand to sit: SBA ? ?GAIT: ?Gait pattern: WFL ?Distance walked: 115 ?Assistive device utilized: None ?Level of assistance: Modified independence ?Comments: no unsteadiness noted ?Gait Speed: 8.25 seconds = 3.97 ft/sec without use of AD ? ?FUNCTIONAL TESTs:  ?5 times sit to stand: 15.24 secs without UE support, mild forward lean noted upon standing, cues for full upright ?M-CTSIB: Able to hold situation 1-3 for full 30 seconds, situation 4 for 22 seconds.  ? ?  [facility] PT Assessment - 09/28/21 0001   ? ?  ? Functional Gait  Assessment  ? Gait assessed  Yes   ? Gait Level Surface Walks 20 ft in less than 7 sec but greater than 5.5 sec, uses assistive device, slower speed, mild gait deviations, or deviates 6-10 in outside of the 12 in walkway width.   ? Change in Gait Speed Able to smoothly change walking speed without loss of balance or gait deviation. Deviate no more than 6 in outside of the 12 in walkway width.   ? Gait with Horizontal Head Turns Performs head turns smoothly with slight change in gait velocity (eg, minor disruption to smooth gait path), deviates 6-10 in outside 12 in walkway width, or uses an assistive device.   ? Gait with Vertical Head Turns Performs task with slight change in gait velocity (eg, minor disruption to smooth gait path), deviates 6 - 10 in outside 12 in walkway width or uses assistive device   ? Gait and Pivot Turn Pivot turns safely within 3 sec and stops quickly with no loss of balance.   ? Step Over Obstacle Is able to step over one shoe box (4.5 in total height) without changing gait speed. No evidence of imbalance.   ? Gait with Narrow Base of Support Ambulates 4-7 steps.   ? Gait with Eyes Closed Walks 20 ft, uses assistive device, slower speed, mild gait deviations, deviates 6-10 in outside 12 in walkway width. Ambulates 20 ft in less than 9 sec but greater than 7 sec.   ? Ambulating Backwards Walks 20 ft, uses assistive device, slower speed, mild gait deviations, deviates 6-10 in outside 12 in walkway width.   ? Steps Alternating feet, must use rail.   ? Total Score 21   ? FGA comment: 21/30   ? ?  ?  ? ?  ? ?Neuro re-ed: sensory organization test performed with following results: ?Conditions: ?1: all above age related norm ?2: all above age related norm ?3: all above age related norm  ?4: all above age related norm ?5: all above age related norm ?6: 1st fall, 2nd and 3rd trial above age related norm ?Composite score: 74% (WNL)   ?Sensory Analysis ?Som: WNL ?Vis: WNL ?Vest: WNL ?Pref: WNL ?Strategy analysis: Ankle Dominant       ?COG alignment: Neutral COG      ? ? ?VESTIBULAR ASSESSMENT ? ?  ? SYMPTOM BEHAVIOR: ?  Subjective history: Denies History of Dizziness.  ?   ? ?PATIENT EDUCATION: ?Education details: Educated on POC/Eval Findings ?Person educated: Patient ?Education method: Explanation ?Education comprehension: verbalized understanding ? ?ASSESSMENT: ? ?CLINICAL IMPRESSION: ?Patient is a 76 y.o. male referred to Neuro OPPT services for imbalance/dizziness. Patient's PMH significant for the following: Cancer, CAD, DM, HLD, HTN . Upon evaluation,  patient presents with the following impairments: decreased strength, impaired balance, difficulty with ambulation over unlevel surfaces. Patient scored 21/30 on FGA and 15.24 seconds with five time sit to stand indicating medium fall risk. With SOT, patient WNL for all components. Patient will benefit from skilled PT services to address balance impairments.  ? ? ? ?OBJECTIVE IMPAIRMENTS decreased activity tolerance, decreased balance, difficulty walking, and decreased strength.  ? ?ACTIVITY LIMITATIONS community activity, yard work, and Veterinary surgeon .  ? ?PERSONAL FACTORS Age and 3+ comorbidities: Cancer, CAD, DM, HLD, HTN  are also affecting patient's functional outcome.  ? ? ?REHAB POTENTIAL: Excellent ? ?CLINICAL DECISION MAKING: Stable/uncomplicated ? ?EVALUATION COMPLEXITY: Low ? ? ?GOALS: ?Goals reviewed with patient? Yes ? ?SHORT TERM GOALS: Target date:  11/07/21 ? ?Pt will be independent with initial HEP for improved balance/strength  ?Baseline: no HEP established ?Goal status: INITIAL ? ?2.  Pt will improve FGA to >/= 23/30 to demonstrate improved balance and reduced fall risk ?Baseline: 21/30 ?Goal status: INITIAL ? ?3. Pt will improve 5x STS to </= 12 sec to demo improved functional LE strength and balance  ?Baseline: 15.24 secs ?Goal status: INITIAL ? ? ? ?LONG TERM GOALS:  Target date:  12/02/21 ? ?Pt will be independent with final HEP for improved balance/strength ?Baseline: no HEP established ?Goal status: INITIAL ? ?2.  Pt will improve FGA to >/= 26/30 to demonstrate improved balan

## 2021-09-30 ENCOUNTER — Other Ambulatory Visit: Payer: Self-pay

## 2021-09-30 ENCOUNTER — Encounter (HOSPITAL_COMMUNITY)
Admission: RE | Admit: 2021-09-30 | Discharge: 2021-09-30 | Disposition: A | Discharge status: Home / Self Care | Payer: PPO | Source: Ambulatory Visit | Attending: Cardiology | Admitting: Cardiology

## 2021-09-30 DIAGNOSIS — Z955 Presence of coronary angioplasty implant and graft: Secondary | ICD-10-CM | POA: Diagnosis not present

## 2021-10-03 ENCOUNTER — Encounter (HOSPITAL_COMMUNITY)
Admission: RE | Admit: 2021-10-03 | Discharge: 2021-10-03 | Disposition: A | Discharge status: Home / Self Care | Payer: PPO | Source: Ambulatory Visit | Attending: Cardiology | Admitting: Cardiology

## 2021-10-03 ENCOUNTER — Other Ambulatory Visit: Payer: Self-pay

## 2021-10-03 DIAGNOSIS — Z955 Presence of coronary angioplasty implant and graft: Secondary | ICD-10-CM | POA: Diagnosis not present

## 2021-10-05 ENCOUNTER — Other Ambulatory Visit: Payer: Self-pay

## 2021-10-05 ENCOUNTER — Encounter (HOSPITAL_COMMUNITY)
Admission: RE | Admit: 2021-10-05 | Discharge: 2021-10-05 | Disposition: A | Discharge status: Home / Self Care | Payer: PPO | Source: Ambulatory Visit | Attending: Cardiology | Admitting: Cardiology

## 2021-10-05 DIAGNOSIS — Z955 Presence of coronary angioplasty implant and graft: Secondary | ICD-10-CM

## 2021-10-07 ENCOUNTER — Encounter (HOSPITAL_COMMUNITY)
Admission: RE | Admit: 2021-10-07 | Discharge: 2021-10-07 | Disposition: A | Discharge status: Home / Self Care | Payer: PPO | Source: Ambulatory Visit | Attending: Cardiology | Admitting: Cardiology

## 2021-10-07 ENCOUNTER — Other Ambulatory Visit: Payer: Self-pay

## 2021-10-07 DIAGNOSIS — Z955 Presence of coronary angioplasty implant and graft: Secondary | ICD-10-CM | POA: Diagnosis not present

## 2021-10-10 ENCOUNTER — Other Ambulatory Visit: Payer: Self-pay

## 2021-10-10 ENCOUNTER — Encounter (HOSPITAL_COMMUNITY)
Admission: RE | Admit: 2021-10-10 | Discharge: 2021-10-10 | Disposition: A | Discharge status: Home / Self Care | Payer: PPO | Source: Ambulatory Visit | Attending: Cardiology | Admitting: Cardiology

## 2021-10-10 DIAGNOSIS — Z955 Presence of coronary angioplasty implant and graft: Secondary | ICD-10-CM

## 2021-10-12 ENCOUNTER — Other Ambulatory Visit: Payer: Self-pay

## 2021-10-12 ENCOUNTER — Encounter (HOSPITAL_COMMUNITY)
Admission: RE | Admit: 2021-10-12 | Discharge: 2021-10-12 | Disposition: A | Discharge status: Home / Self Care | Payer: PPO | Source: Ambulatory Visit | Attending: Cardiology | Admitting: Cardiology

## 2021-10-12 DIAGNOSIS — Z955 Presence of coronary angioplasty implant and graft: Secondary | ICD-10-CM | POA: Diagnosis not present

## 2021-10-14 ENCOUNTER — Encounter (HOSPITAL_COMMUNITY)
Admission: RE | Admit: 2021-10-14 | Discharge: 2021-10-14 | Disposition: A | Discharge status: Home / Self Care | Payer: PPO | Source: Ambulatory Visit | Attending: Cardiology | Admitting: Cardiology

## 2021-10-14 DIAGNOSIS — Z955 Presence of coronary angioplasty implant and graft: Secondary | ICD-10-CM | POA: Diagnosis not present

## 2021-10-17 ENCOUNTER — Encounter (HOSPITAL_COMMUNITY)
Admission: RE | Admit: 2021-10-17 | Discharge: 2021-10-17 | Disposition: A | Discharge status: Home / Self Care | Payer: PPO | Source: Ambulatory Visit | Attending: Cardiology | Admitting: Cardiology

## 2021-10-17 DIAGNOSIS — Z955 Presence of coronary angioplasty implant and graft: Secondary | ICD-10-CM | POA: Diagnosis not present

## 2021-10-19 ENCOUNTER — Encounter (HOSPITAL_COMMUNITY)
Admission: RE | Admit: 2021-10-19 | Discharge: 2021-10-19 | Disposition: A | Discharge status: Home / Self Care | Payer: PPO | Source: Ambulatory Visit | Attending: Cardiology | Admitting: Cardiology

## 2021-10-19 VITALS — Ht 72.75 in | Wt 161.6 lb

## 2021-10-19 DIAGNOSIS — Z955 Presence of coronary angioplasty implant and graft: Secondary | ICD-10-CM | POA: Diagnosis not present

## 2021-10-19 NOTE — Progress Notes (Signed)
Logan Gonzales 76 y.o. male ?Nutrition Note ?Patient reports concerns of weight loss. He is interested in adding a protein supplement to his diet. He reports goal weight of ~180# and his highest adult weight was ~250# per patient. He reports eating three regular meals per day.  He has medical history of CAD, DM. His most recent A1c 5.5.  ? ?No results found for: HGBA1C ? ?Wt Readings from Last 3 Encounters:  ?08/25/21 165 lb 5.5 oz (75 kg)  ?07/23/20 180 lb (81.6 kg)  ?07/06/20 180 lb (81.6 kg)  ? ? ?Nutrition Diagnosis ?Unintentional wt loss related to weight trends/documentation as evidenced by BMI of 21.47 ? ?Nutrition Intervention ?Pt?s individual nutrition plan reviewed with pt.   ?Continue client-centered nutrition education by RD, as part of interdisciplinary care. ? ?Goal(s) ?Continue to use the plate method as a guide for meal planning.  ?Consider implementing a protein shake/supplement as a snack OR ~250+ calorie snack to support calorie needs/weight gain/weight maintenance. May pair with fruit.  ?Continue to monitor weight.  ?Continue to prioritize protein intake at meals and snacks to support lean muscle mass.  ? ?Monitor/Evaluate: ?Logan Gonzales remains motivated to make lifestyle changes to support cardiac rehab, weight gain/weight maintenance. We discussed implementing snack or protein supplement to support his calorie needs. Will give handout. Patient amicable to RD suggestions. Will follow-up as needed.  ? ? ?Plan:  ?Will provide client-centered nutrition education as part of interdisciplinary care ?Monitor and evaluate progress toward nutrition goal with team. ? ?Eryanna Regal [place], MS, RDN, LDN  ?

## 2021-10-21 ENCOUNTER — Encounter (HOSPITAL_COMMUNITY)
Admission: RE | Admit: 2021-10-21 | Discharge: 2021-10-21 | Disposition: A | Discharge status: Home / Self Care | Payer: PPO | Source: Ambulatory Visit | Attending: Cardiology | Admitting: Cardiology

## 2021-10-21 VITALS — BP 98/60 | HR 62 | Ht 72.75 in | Wt 159.8 lb

## 2021-10-21 DIAGNOSIS — Z955 Presence of coronary angioplasty implant and graft: Secondary | ICD-10-CM

## 2021-10-21 NOTE — Progress Notes (Signed)
Discharge Progress Report ? ?Patient Details  ?Name: Logan Gonzales ?MRN: [id number] ?Date of Birth: January 08, 1946 ?Referring Provider:   ?Flowsheet Row CARDIAC REHAB PHASE II ORIENTATION from 08/25/2021 in [place]  ?Referring Provider Dr Loni Beckwith MD, [facility]/ Dr Fransico Him MD Covering  ? ?  ? ? ? ?Number of Visits: 22 ? ?Reason for Discharge:  ?Patient reached a stable level of exercise. ?Patient independent in their exercise. ?Patient has met program and personal goals. ? ?Smoking History:  ?Social History  ? ?Tobacco Use  ?Smoking Status Never  ?Smokeless Tobacco Never  ? ? ?Diagnosis:  ?11/25/20 S/P CTO DES LAD at [facility] Dr Jac [facility]avan ? ?ADL UCSD: ? ? ?Initial Exercise Prescription: ? Initial Exercise Prescription - 08/25/21 1100   ? ?  ? Date of Initial Exercise RX and Referring Provider  ? Date 08/25/21   ? Referring Provider Dr Loni Beckwith MD, [facility]/ Dr Fransico Him MD Covering   ? Expected Discharge Date 10/21/21   ?  ? Treadmill  ? MPH 3.2   ? Grade 0   ? Minutes 15   ? METs 3.45   ?  ? Bike  ? Level 1.2   ? Minutes 15   ? METs 2.5   ?  ? Prescription Details  ? Frequency (times per week) 3   ? Duration Progress to 30 minutes of continuous aerobic without signs/symptoms of physical distress   ?  ? Intensity  ? THRR 40-80% of Max Heartrate 58-115   ? Ratings of Perceived Exertion 11-13   ? Perceived Dyspnea 0-4   ?  ? Progression  ? Progression Continue progressive overload as per policy without signs/symptoms or physical distress.   ?  ? Resistance Training  ? Training Prescription Yes   ? Weight 5   ? Reps 10-15   ? ?  ?  ? ?  ? ? ?Discharge Exercise Prescription (Final Exercise Prescription Changes): ? Exercise Prescription Changes - 10/21/21 1017   ? ?  ? Response to Exercise  ? Blood Pressure (Admit) 98/60   ? Blood Pressure (Exercise) 128/72   ? Blood Pressure (Exit) 100/60   ? Heart Rate (Admit) 62 bpm   ? Heart Rate (Exercise) 99 bpm   ? Heart Rate  (Exit) 61 bpm   ? Rating of Perceived Exertion (Exercise) 13   ? Symptoms None   ? Comments Patient completed the cardiac rehab program.   ? Duration Continue with 30 min of aerobic exercise without signs/symptoms of physical distress.   ? Intensity THRR unchanged   ?  ? Progression  ? Progression Continue to progress workloads to maintain intensity without signs/symptoms of physical distress.   ? Average METs 4.2   ?  ? Resistance Training  ? Training Prescription Yes   ? Weight 5   ? Reps 10-15   ? Time 10 Minutes   ?  ? Interval Training  ? Interval Training No   ?  ? Treadmill  ? MPH 3.5   ? Grade 0   ? Minutes 15   ? METs 3.68   ?  ? Bike  ? Level 1.5   ? Minutes 15   ? METs 4.73   ?  ? Home Exercise Plan  ? Plans to continue exercise at [facility] (comment)   ? Frequency Add 3 additional days to program exercise sessions.   ? Initial Home Exercises Provided 09/16/21   ? ?  ?  ? ?  ? ? ?  Functional Capacity: ? 6 Minute Walk   ? ? [place] Name 08/25/21 1150 10/19/21 1034  ?  ?  ? 6 Minute Walk  ? Phase Initial Discharge   ? Distance 1675 feet 2013 feet   ? Distance % Change -- 20.18 %   ? Distance Feet Change -- 338 ft   ? Walk Time 6 minutes 6 minutes   ? # of Rest Breaks 0 0   ? MPH 3.17 3.81   ? METS 3.42 4.03   ? RPE 9 11   ? Perceived Dyspnea  0 0   ? VO2 Peak 11.95 14.12   ? Symptoms Yes (comment) No   ? Comments Entry BP low. 90/47 after H2O sitting 91/49, stand 86/51. Pt was symptomatic, Slight dizzy. Later recheck 99/53 asymptomatic. --   ? Resting HR 63 bpm 63 bpm   ? Resting BP 90/47 104/60   ? Resting Oxygen Saturation  97 % --   ? Exercise Oxygen Saturation  during 6 min walk 99 % --   ? Max Ex. HR 73 bpm 85 bpm   ? Max Ex. BP 128/57 118/56   ? 2 Minute Post BP 108/56 96/50   ? ?  ?  ? ?  ? ? ?Psychological, QOL, Others - Outcomes: ?PHQ 2/9: ? ?  10/21/2021  ? 11:10 AM 08/25/2021  ?  1:48 PM  ?Depression screen PHQ 2/9  ?Decreased Interest 0 0  ?Down, Depressed, Hopeless 0 0  ?PHQ - 2 Score 0 0   ? ? ?Quality of Life: ? Quality of Life - 10/14/21 1204   ? ?  ? Quality of Life  ? Select Quality of Life   ?  ? Quality of Life Scores  ? Health/Function Pre 27.2 %   ? Health/Function Post 29.43 %   ? Health/Function % Change 8.2 %   ? Socioeconomic Pre 27.21 %   ? Socioeconomic Post 30 %   ? Socioeconomic % Change  10.25 %   ? Psych/Spiritual Pre 30 %   ? Psych/Spiritual Post 30 %   ? Psych/Spiritual % Change 0 %   ? Family Pre 27.6 %   ? Family Post 30 %   ? Family % Change 8.7 %   ? GLOBAL Pre 27.84 %   ? GLOBAL Post 29.75 %   ? GLOBAL % Change 6.86 %   ? ?  ?  ? ?  ? ? ?Personal Goals: ?Goals established at orientation with interventions provided to work toward goal. ? Personal Goals and Risk Factors at Admission - 08/25/21 1202   ? ?  ? Core Components/Risk Factors/Patient Goals on Admission  ?  Weight Management Yes;Weight Maintenance   Lost weight, but feels weak and wants muscle strength  ? Intervention Weight Management: Develop a combined nutrition and exercise program designed to reach desired caloric intake, while maintaining appropriate intake of nutrient and fiber, sodium and fats, and appropriate energy expenditure required for the weight goal.;Weight Management: Provide education and appropriate resources to help participant work on and attain dietary goals.   ? Admit Weight 165 lb 5.5 oz (75 kg)   ? Expected Outcomes Weight Maintenance: Understanding of the daily nutrition guidelines, which includes 25-35% calories from fat, 7% or less cal from saturated fats, less than 266m cholesterol, less than 1.5gm of sodium, & 5 or more servings of fruits and vegetables daily;Short Term: Continue to assess and modify interventions until short term weight is achieved;Long Term: Adherence to  nutrition and physical activity/exercise program aimed toward attainment of established weight goal;Weight Gain: Understanding of general recommendations for a high calorie, high protein meal plan that promotes weight  gain by distributing calorie intake throughout the day with the consumption for 4-5 meals, snacks, and/or supplements;Understanding of distribution of calorie intake throughout the day with the consumption of 4-5 meals/snacks;Understanding recommendations for meals to include 15-35% energy as protein, 25-35% energy from fat, 35-60% energy from carbohydrates, less than 235m of dietary cholesterol, 20-35 gm of total fiber daily   ? Diabetes Yes   ? Intervention Provide education about signs/symptoms and action to take for hypo/hyperglycemia.;Provide education about proper nutrition, including hydration, and aerobic/resistive exercise prescription along with prescribed medications to achieve blood glucose in normal ranges: Fasting glucose 65-99 mg/dL   ? Expected Outcomes Short Term: Participant verbalizes understanding of the signs/symptoms and immediate care of hyper/hypoglycemia, proper foot care and importance of medication, aerobic/resistive exercise and nutrition plan for blood glucose control.;Long Term: Attainment of HbA1C < 7%.   ? Hypertension Yes   ? Intervention Provide education on lifestyle modifcations including regular physical activity/exercise, weight management, moderate sodium restriction and increased consumption of fresh fruit, vegetables, and low fat dairy, alcohol moderation, and smoking cessation.;Monitor prescription use compliance.   ? Expected Outcomes Short Term: Continued assessment and intervention until BP is < 140/987mHG in hypertensive participants. < 130/8036mG in hypertensive participants with diabetes, heart failure or chronic kidney disease.;Long Term: Maintenance of blood pressure at goal levels.   ? Lipids Yes   ? Intervention Provide education and support for participant on nutrition & aerobic/resistive exercise along with prescribed medications to achieve LDL <24m66mDL >40mg19m? Expected Outcomes Short Term: Participant states understanding of desired cholesterol values  and is compliant with medications prescribed. Participant is following exercise prescription and nutrition guidelines.;Long Term: Cholesterol controlled with medications as prescribed, with individualized ex

## 2021-10-24 ENCOUNTER — Ambulatory Visit: Payer: PPO | Attending: Internal Medicine | Admitting: Physical Therapy

## 2021-10-24 DIAGNOSIS — R2681 Unsteadiness on feet: Secondary | ICD-10-CM | POA: Insufficient documentation

## 2021-10-24 DIAGNOSIS — M6281 Muscle weakness (generalized): Secondary | ICD-10-CM | POA: Insufficient documentation

## 2021-10-24 DIAGNOSIS — R2689 Other abnormalities of gait and mobility: Secondary | ICD-10-CM | POA: Diagnosis not present

## 2021-10-24 NOTE — Therapy (Signed)
?OUTPATIENT PHYSICAL THERAPY TREATMENT NOTE ? ? ?Patient Name: Logan Gonzales ?MRN: [id number] ?DOB:[birth year], 76 y.o., male ?Today's Date: 10/24/2021 ? ?PCP: Deland Pretty, MD ?REFERRING PROVIDER: Deland Pretty, MD ? ?END OF SESSION:  ? PT End of Session - 10/24/21 0935   ? ? Visit Number 2   ? Number of Visits 7   ? Date for PT Re-Evaluation 12/02/21   ? Authorization Type [employer]   ? Progress Note Due on Visit 10   ? PT Start Time 1497   ? PT Stop Time 1015   ? PT Time Calculation (min) 40 min   ? Activity Tolerance Patient tolerated treatment well   ? Behavior During Therapy [facility] for tasks assessed/performed   ? ?  ?  ? ?  ? ? ?Past Medical History:  ?Diagnosis Date  ? Cancer [facility])   ? Coronary artery disease due to calcified coronary lesion   ? Diabetes mellitus without complication ([place])   ? Hyperlipidemia   ? Hypertension   ? S/P drug eluting coronary stent placement 11/25/2020  ? CTO at [facility]  ? ?Past Surgical History:  ?Procedure Laterality Date  ? BLADDER SURGERY    ? CARDIAC CATHETERIZATION    ? HERNIA REPAIR    ? LEFT HEART CATH AND CORONARY ANGIOGRAPHY N/A 07/06/2020  ? Procedure: LEFT HEART CATH AND CORONARY ANGIOGRAPHY;  Surgeon: Adrian Prows, MD;  Location: [place] [facility];  Service: Cardiovascular;  Laterality: N/A;  ? SHOULDER SURGERY Right   ? ?Patient Active Problem List  ? Diagnosis Date Noted  ? Dyspnea on exertion 07/05/2020  ? CAD in native artery 07/05/2020  ? DM (diabetes mellitus) ([place]) 07/21/2012  ? ? ?REFERRING DIAG: R26.89 (ICD-10-CM) - Balance disorder  ? ?THERAPY DIAG:  ?Unsteadiness on feet ? ?Muscle weakness (generalized) ? ?Other abnormalities of gait and mobility ? ?PERTINENT HISTORY: Cancer, CAD, DM, HLD, HTN  ? ?PRECAUTIONS: Fall  ? ?SUBJECTIVE: No new changes to report  ? ?PAIN:  ?Are you having pain? No ? ?OBJECTIVE:  ?  ?TODAY'S TREATMENT ?Ther Ex ?SciFit level 5 for 6 minutes w/BLE/BUEs for dynamic cardiovascular warmup, BLE strength and UE/LE  coordination. Pt maintained steps/min >75 throughout.  ?  ?Ther Act ?Established and demonstrated initial HEP (see below) with emphasis on single leg stability, narrow BOS and BLE strength. Pt able to demonstrate movements with min cues. ? ?NMR ?In // bars, standing on rockerboard w/EO for improved dynamic standing balance and ankle strategy. Pt able to hold for 2 minutes without BUE support.  Progressed to fwd/retro rocking without BUE support and CGA for anterior/posterior weight shifting x64mn   ? ?  ?PATIENT EDUCATION: ?Education details: Initial HEP, walking program, importance of protein for muscle growth  ?Person educated: Patient ?Education method: Explanation and handout  ?Education comprehension: verbalized understanding ? ?  HOME EXERCISE PROGRAM  ?Access Code: [email or web address] ?URL: [email or web address] ?Date: 10/24/2021 ?Prepared by: JMickie BailPlaster ? ?Exercises ?- Forward Step Down Touch with Heel  - 1 x daily - 7 x weekly - 3 sets - 10 reps ?- Backward heel to toe walking   - 1 x daily - 7 x weekly - 3 sets - 10 reps ?- Heel to toe walking   - 1 x daily - 7 x weekly - 3 sets - 10 reps ?- Standing Single Leg Stance with Counter Support  - 1 x daily - 7 x weekly - 3 sets - 10 reps ?- Single leg deadlift   -  1 x daily - 7 x weekly - 3 sets - 10 reps ?  ?ASSESSMENT: ?  ?CLINICAL IMPRESSION: ?Emphasis of skilled PT session on establishing initial HEP for improved single leg stability and narrow BOS. Pt able to demonstrate exercises well without need for assistance and exhibited the greatest difficulty with single leg deadlifts. Pt able to easily stand on rockerboard, will progress single leg stability exercises next session. Continue POC.  ?  ?  ?OBJECTIVE IMPAIRMENTS decreased activity tolerance, decreased balance, difficulty walking, and decreased strength.  ?  ?ACTIVITY LIMITATIONS community activity, yard work, and Veterinary surgeon .  ?  ?PERSONAL FACTORS Age and 3+ comorbidities: Cancer, CAD, DM,  HLD, HTN  are also affecting patient's functional outcome.  ?  ?  ?REHAB POTENTIAL: Excellent ?  ?CLINICAL DECISION MAKING: Stable/uncomplicated ?  ?EVALUATION COMPLEXITY: Low ?  ?  ?GOALS: ?Goals reviewed with patient? Yes ?  ?SHORT TERM GOALS: Target date:  11/07/21 ?  ?Pt will be independent with initial HEP for improved balance/strength  ?Baseline: no HEP established ?Goal status: INITIAL ?  ?2.  Pt will improve FGA to >/= 23/30 to demonstrate improved balance and reduced fall risk ?Baseline: 21/30 ?Goal status: INITIAL ?  ?3. Pt will improve 5x STS to </= 12 sec to demo improved functional LE strength and balance  ?Baseline: 15.24 secs ?Goal status: INITIAL ?  ?  ?  ?LONG TERM GOALS: Target date:  12/02/21 ?  ?Pt will be independent with final HEP for improved balance/strength ?Baseline: no HEP established ?Goal status: INITIAL ?  ?2.  Pt will improve FGA to >/= 26/30 to demonstrate improved balance and reduced fall risk ?Baseline: 21/30 ?Goal status: INITIAL ?  ?3.  Patient will improve situation 4 of M-CTSIB to >/= 30 seconds without LOB ?Baseline: 22 seconds ?Goal status: INITIAL ?  ?4.  Pt will verbalize ability to complete fly fishing without any instance of imbalance or LOB noted ?Baseline: has not completed recently ?Goal status: INITIAL ?  ?PLAN: ?PT FREQUENCY: 1x/week ?  ?PT DURATION: 6 weeks ?  ?PLANNED INTERVENTIONS: Therapeutic exercises, Therapeutic activity, Neuromuscular re-education, Balance training, Gait training, Patient/Family education, Joint mobilization, Stair training, Vestibular training, and Manual therapy ?  ?PLAN FOR NEXT SESSION: Alt. Walking lunges w/slam ball, rockerboard in front of rebounder, standing marches w/OH hold, step ups to Bosu, add KB to single leg RDLs  ?  ? ? ?Cruzita Lederer Jasa Dundon, PT, DPT ?10/24/2021, 10:30 AM ? ?   ?

## 2021-10-31 ENCOUNTER — Ambulatory Visit: Payer: PPO | Admitting: Physical Therapy

## 2021-10-31 DIAGNOSIS — R2681 Unsteadiness on feet: Secondary | ICD-10-CM | POA: Diagnosis not present

## 2021-10-31 DIAGNOSIS — M6281 Muscle weakness (generalized): Secondary | ICD-10-CM

## 2021-10-31 DIAGNOSIS — R2689 Other abnormalities of gait and mobility: Secondary | ICD-10-CM

## 2021-10-31 NOTE — Therapy (Signed)
?OUTPATIENT PHYSICAL THERAPY TREATMENT NOTE ? ? ?Patient Name: Logan Gonzales ?MRN: [id number] ?DOB:18-Aug-1945, 76 y.o., male ?Today's Date: 10/31/2021 ? ?PCP: Deland Pretty, MD ?REFERRING PROVIDER: Deland Pretty, MD ? ?END OF SESSION:  ? PT End of Session - 10/31/21 0936   ? ? Visit Number 3   ? Number of Visits 7   ? Date for PT Re-Evaluation 12/02/21   ? Authorization Type [employer]   ? Progress Note Due on Visit 10   ? PT Start Time [phone]   ? Activity Tolerance Patient tolerated treatment well   ? Behavior During Therapy [facility] for tasks assessed/performed   ? ?  ?  ? ?  ? ? ?Past Medical History:  ?Diagnosis Date  ? Cancer [facility])   ? Coronary artery disease due to calcified coronary lesion   ? Diabetes mellitus without complication ([place])   ? Hyperlipidemia   ? Hypertension   ? S/P drug eluting coronary stent placement 11/25/2020  ? CTO at [facility] Dr Jac Canavan  ? ?Past Surgical History:  ?Procedure Laterality Date  ? BLADDER SURGERY    ? CARDIAC CATHETERIZATION    ? HERNIA REPAIR    ? LEFT HEART CATH AND CORONARY ANGIOGRAPHY N/A 07/06/2020  ? Procedure: LEFT HEART CATH AND CORONARY ANGIOGRAPHY;  Surgeon: Adrian Prows, MD;  Location: [place] [facility];  Service: Cardiovascular;  Laterality: N/A;  ? SHOULDER SURGERY Right   ? ?Patient Active Problem List  ? Diagnosis Date Noted  ? Dyspnea on exertion 07/05/2020  ? CAD in native artery 07/05/2020  ? DM (diabetes mellitus) ([place]) 07/21/2012  ? ? ?REFERRING DIAG: R26.89 (ICD-10-CM) - Balance disorder  ? ?THERAPY DIAG:  ?Unsteadiness on feet ? ?Muscle weakness (generalized) ? ?Other abnormalities of gait and mobility ? ?PERTINENT HISTORY: Cancer, CAD, DM, HLD, HTN  ? ?PRECAUTIONS: Fall  ? ?SUBJECTIVE: Pt reports wife has new dx of dementia and has not been able to do HEP as he much as he likes; pt reports walking and going to gym multiple times since last visit. ? ?PAIN:  ?Are you having pain? No ? ?OBJECTIVE:  ?  ?TODAY'S TREATMENT ?10/31/21:  ? ?Ther Ex ?SciFit level 5  for 6 minutes w/BLE/BUEs for dynamic cardiovascular warmup, BLE strength and UE/LE coordination. Cues to slow down and increase ROM. Pt maintained steps/min >70 throughout.  ?  ?Ther Act ?Reviewed /performed initial HEP (see below) with emphasis on single leg stability, narrow BOS and BLE strength. Pt able to demonstrate movements with min cues to slow down and for minor technique changes. ? ?NMR ?Core strengthening/coordiantion/balance: quadruped- 1. contralateralUE/LE  extensions 5x hold 5 sec, 2. Rocking forward <> tall knee with trunk extension UE abducted x5 ?  ?PATIENT EDUCATION: ?Education details: Reviewed Initial HEP, Reviewed walking program, Reviewed importance of protein for muscle growth.  Strategies for HEP compliance with caregiving at home- decreasing number of reps, slowing down balance exercises, and rotating though HEP handouts ?Person educated: Patient ?Education method: Explanation and handout  ?Education comprehension: verbalized understanding ? ?  HOME EXERCISE PROGRAM  ?Access Code: [email or web address] ?URL: [email or web address] ?Date: 10/24/2021 ?Prepared by: Mickie Bail Plaster ? ?Exercises ?- Forward Step Down Touch with Heel  - 1 x daily - 7 x weekly - 3 sets - 10 reps ?- Backward heel to toe walking   - 1 x daily - 7 x weekly - 3 sets - 10 reps ?- Heel to toe walking   - 1 x daily - 7 x weekly - 3  sets - 10 reps ?- Standing Single Leg Stance with Counter Support  - 1 x daily - 7 x weekly - 3 sets - 10 reps ?- Single leg deadlift   - 1 x daily - 7 x weekly - 3 sets - 10 reps ?  ?ASSESSMENT: ?  ?CLINICAL IMPRESSION: ?10/31/21: Pt is progressing with understanding of HEP and performing at home. Addressed pt's concern of having less time to review HEP due to new needs of caring for wife with dementia.  Encouraged pt to decrease reps of HEP and slow down, do several exercises most days of the week rotating through the exercises for increase compliance/benefit for balance.  ?  ?OBJECTIVE  IMPAIRMENTS decreased activity tolerance, decreased balance, difficulty walking, and decreased strength.  ?  ?ACTIVITY LIMITATIONS community activity, yard work, and Veterinary surgeon .  ?  ?PERSONAL FACTORS Age and 3+ comorbidities: Cancer, CAD, DM, HLD, HTN  are also affecting patient's functional outcome.  ?  ?  ?REHAB POTENTIAL: Excellent ?  ?CLINICAL DECISION MAKING: Stable/uncomplicated ?  ?EVALUATION COMPLEXITY: Low ?  ?  ?GOALS: ?Goals reviewed with patient? Yes ?  ?SHORT TERM GOALS: Target date:  11/07/21 ?  ?Pt will be independent with initial HEP for improved balance/strength  ?Baseline: no HEP established ?Goal status: INITIAL ?  ?2.  Pt will improve FGA to >/= 23/30 to demonstrate improved balance and reduced fall risk ?Baseline: 21/30 ?Goal status: INITIAL ?  ?3. Pt will improve 5x STS to </= 12 sec to demo improved functional LE strength and balance  ?Baseline: 15.24 secs ?Goal status: INITIAL ?  ?  ?  ?LONG TERM GOALS: Target date:  12/02/21 ?  ?Pt will be independent with final HEP for improved balance/strength ?Baseline: no HEP established ?Goal status: INITIAL ?  ?2.  Pt will improve FGA to >/= 26/30 to demonstrate improved balance and reduced fall risk ?Baseline: 21/30 ?Goal status: INITIAL ?  ?3.  Patient will improve situation 4 of M-CTSIB to >/= 30 seconds without LOB ?Baseline: 22 seconds ?Goal status: INITIAL ?  ?4.  Pt will verbalize ability to complete fly fishing without any instance of imbalance or LOB noted ?Baseline: has not completed recently ?Goal status: INITIAL ?  ?PLAN: ?PT FREQUENCY: 1x/week ?  ?PT DURATION: 6 weeks ?  ?PLANNED INTERVENTIONS: Therapeutic exercises, Therapeutic activity, Neuromuscular re-education, Balance training, Gait training, Patient/Family education, Joint mobilization, Stair training, Vestibular training, and Manual therapy ?  ?PLAN FOR NEXT SESSION: Alt. Walking lunges w/slam ball, rockerboard in front of rebounder, standing marches w/OH hold, step ups to Bosu,  add KB to single leg RDLs  ?  ? ? ?Bjorn Loser, PTA  ?10/31/21, 12:43 PM  ?   ?

## 2021-11-01 DIAGNOSIS — Z9861 Coronary angioplasty status: Secondary | ICD-10-CM | POA: Diagnosis not present

## 2021-11-01 DIAGNOSIS — Z955 Presence of coronary angioplasty implant and graft: Secondary | ICD-10-CM | POA: Diagnosis not present

## 2021-11-01 DIAGNOSIS — E7800 Pure hypercholesterolemia, unspecified: Secondary | ICD-10-CM | POA: Diagnosis not present

## 2021-11-01 DIAGNOSIS — I2510 Atherosclerotic heart disease of native coronary artery without angina pectoris: Secondary | ICD-10-CM | POA: Diagnosis not present

## 2021-11-01 DIAGNOSIS — Z79899 Other long term (current) drug therapy: Secondary | ICD-10-CM | POA: Diagnosis not present

## 2021-11-01 DIAGNOSIS — I10 Essential (primary) hypertension: Secondary | ICD-10-CM | POA: Diagnosis not present

## 2021-11-01 DIAGNOSIS — E118 Type 2 diabetes mellitus with unspecified complications: Secondary | ICD-10-CM | POA: Diagnosis not present

## 2021-11-01 DIAGNOSIS — E782 Mixed hyperlipidemia: Secondary | ICD-10-CM | POA: Diagnosis not present

## 2021-11-01 DIAGNOSIS — E1136 Type 2 diabetes mellitus with diabetic cataract: Secondary | ICD-10-CM | POA: Diagnosis not present

## 2021-11-07 ENCOUNTER — Ambulatory Visit: Payer: PPO

## 2021-11-07 DIAGNOSIS — R2681 Unsteadiness on feet: Secondary | ICD-10-CM | POA: Diagnosis not present

## 2021-11-07 DIAGNOSIS — M6281 Muscle weakness (generalized): Secondary | ICD-10-CM

## 2021-11-07 DIAGNOSIS — R2689 Other abnormalities of gait and mobility: Secondary | ICD-10-CM

## 2021-11-07 NOTE — Therapy (Signed)
?OUTPATIENT PHYSICAL THERAPY TREATMENT NOTE ? ? ?Patient Name: Logan Gonzales ?MRN: [id number] ?DOB:09-28-1945, 76 y.o., male ?Today's Date: 11/07/2021 ? ?PCP: Deland Pretty, MD ?REFERRING PROVIDER: Deland Pretty, MD ? ?END OF SESSION:  ? PT End of Session - 11/07/21 0930   ? ? Visit Number 4   ? Number of Visits 7   ? Date for PT Re-Evaluation 12/02/21   ? Authorization Type [employer]   ? Progress Note Due on Visit 10   ? PT Start Time 0930   ? PT Stop Time 1015   ? PT Time Calculation (min) 45 min   ? Activity Tolerance Patient tolerated treatment well   ? Behavior During Therapy [facility] for tasks assessed/performed   ? ?  ?  ? ?  ? ? ?Past Medical History:  ?Diagnosis Date  ? Cancer [facility])   ? Coronary artery disease due to calcified coronary lesion   ? Diabetes mellitus without complication ([place])   ? Hyperlipidemia   ? Hypertension   ? S/P drug eluting coronary stent placement 11/25/2020  ? CTO at [facility] Dr Jac Canavan  ? ?Past Surgical History:  ?Procedure Laterality Date  ? BLADDER SURGERY    ? CARDIAC CATHETERIZATION    ? HERNIA REPAIR    ? LEFT HEART CATH AND CORONARY ANGIOGRAPHY N/A 07/06/2020  ? Procedure: LEFT HEART CATH AND CORONARY ANGIOGRAPHY;  Surgeon: Adrian Prows, MD;  Location: [place] CV LAB;  Service: Cardiovascular;  Laterality: N/A;  ? SHOULDER SURGERY Right   ? ?Patient Active Problem List  ? Diagnosis Date Noted  ? Dyspnea on exertion 07/05/2020  ? CAD in native artery 07/05/2020  ? DM (diabetes mellitus) ([place]) 07/21/2012  ? ? ?REFERRING DIAG: R26.89 (ICD-10-CM) - Balance disorder  ? ?THERAPY DIAG:  ?Unsteadiness on feet ? ?Muscle weakness (generalized) ? ?Other abnormalities of gait and mobility ? ?PERTINENT HISTORY: Cancer, CAD, DM, HLD, HTN  ? ?PRECAUTIONS: Fall  ? ?SUBJECTIVE: Patient reports bruising very easy due to blood thinner. Patient is going to have nuclear stress test on May 9th. Reports balance has felt better. No falls or new changes/complaints.  ? ?PAIN:  ?Are you having  pain? No ? ?OBJECTIVE:  ?  ?TODAY'S TREATMENT ?Outcome Measures:  ?5x sit <> stand test: 10.21 secs with intermittent UE support from knees. ? ? [facility] PT Assessment - 11/07/21 0001   ? ?  ? Functional Gait  Assessment  ? Gait assessed  Yes   ? Gait Level Surface Walks 20 ft in less than 7 sec but greater than 5.5 sec, uses assistive device, slower speed, mild gait deviations, or deviates 6-10 in outside of the 12 in walkway width.   ? Change in Gait Speed Able to smoothly change walking speed without loss of balance or gait deviation. Deviate no more than 6 in outside of the 12 in walkway width.   ? Gait with Horizontal Head Turns Performs head turns smoothly with slight change in gait velocity (eg, minor disruption to smooth gait path), deviates 6-10 in outside 12 in walkway width, or uses an assistive device.   ? Gait with Vertical Head Turns Performs head turns with no change in gait. Deviates no more than 6 in outside 12 in walkway width.   ? Gait and Pivot Turn Pivot turns safely within 3 sec and stops quickly with no loss of balance.   ? Step Over Obstacle Is able to step over 2 stacked shoe boxes taped together (9 in total height)  without changing gait speed. No evidence of imbalance.   ? Gait with Narrow Base of Support Is able to ambulate for 10 steps heel to toe with no staggering.   ? Gait with Eyes Closed Walks 20 ft, uses assistive device, slower speed, mild gait deviations, deviates 6-10 in outside 12 in walkway width. Ambulates 20 ft in less than 9 sec but greater than 7 sec.   ? Ambulating Backwards Walks 20 ft, no assistive devices, good speed, no evidence for imbalance, normal gait   ? Steps Alternating feet, no rail.   ? Total Score 27   ? FGA comment: 27/30   ? ?  ?  ? ?  ? ? ?NMR: ?Completed obstacle course: with tandem gait on blue mat, followed by negotiation over floor pebbles working toward increased spaces between pebbles to promote SLS. Completed x 4 laps down and back, CGA required with  completion.  ? ?Ambulation across blue balance mat with crossover toe tap to cone to further challenge SLS, completed x 4 laps down and back.  ? ?Standing on Rockerboard in front of rebounder; with it positioned laterally and then A/P, completed x 20 tosses each. Then transitioned to SLS with trunk rotation and toss. Completed x 10 reps on each leg, increased challenge on LLE > RLE.  ? ?Standing on Rockerboard (Positioned) A/P, completed eyes closed 3 x 30 seconds. Completed alternating step up onto rockerboard with opposite LE tap to cone x 10 reps bilat, then completing double tap x 10 reps bilaterally. CGA. Intermittent UE support.  ? ?Standing Balance: ?Surface: Airex ?Position: Narrow Base of Support ?Completed with: Eyes Closed; Head Turns x 10 Reps and Head Nods x 10 Reps, then completed without head movement and eyes closed 3 x 30 seconds. ? ?Reviewed and Updated HEP: ?Access Code: [email or web address] ?URL: [email or web address] ?Date: 11/07/2021 ?Prepared by: Baldomero Lamy ? ?Exercises ?- Forward Step Down Touch with Heel  - 1 x daily - 7 x weekly - 3 sets - 10 reps ?- Backward heel to toe walking   - 1 x daily - 7 x weekly - 3 sets - 10 reps ?- Heel to toe walking   - 1 x daily - 7 x weekly - 3 sets - 10 reps ?- Standing Single Leg Stance with Counter Support  - 1 x daily - 7 x weekly - 3 sets - 10 reps ?- Single leg deadlift   - 1 x daily - 7 x weekly - 3 sets - 10 reps ?- Romberg Stance Eyes Closed on Foam Pad  - 1 x daily - 7 x weekly - 1 sets - 3 reps - 30 seconds hold ?- Romberg Stance on Foam Pad with Head Rotation with Eyes Closed  - 1 x daily - 7 x weekly - 1 sets - 10 reps ?- Romberg Stance with Head Nods on Foam Pad with Eyes Closed  - 1 x daily - 7 x weekly - 1 sets - 10 reps ?  ? ?PATIENT EDUCATION: ?Education details: Progress toward STGs; HEP Update ?Person educated: Patient ?Education method: Explanation and handout  ?Education comprehension: verbalized understanding ? ?  HOME EXERCISE  PROGRAM  ?Access Code: [email or web address] ? ?  ?ASSESSMENT: ?  ?CLINICAL IMPRESSION: ?Assessed patient's progress toward STGs, with patient able to meet all STGs. Patient improved 5x sit <> stand to 10.21 seconds and FGA to 27/30 indicating improved balance and reduced fall risk. Rest of session focused on continued NMR, especially SLS and vision removed. Will  continue per POC.  ?  ?OBJECTIVE IMPAIRMENTS decreased activity tolerance, decreased balance, difficulty walking, and decreased strength.  ?  ?ACTIVITY LIMITATIONS community activity, yard work, and Veterinary surgeon .  ?  ?PERSONAL FACTORS Age and 3+ comorbidities: Cancer, CAD, DM, HLD, HTN  are also affecting patient's functional outcome.  ?  ?  ?REHAB POTENTIAL: Excellent ?  ?CLINICAL DECISION MAKING: Stable/uncomplicated ?  ?EVALUATION COMPLEXITY: Low ?  ?  ?GOALS: ?Goals reviewed with patient? Yes ?  ?SHORT TERM GOALS: Target date:  11/07/21 ?  ?Pt will be independent with initial HEP for improved balance/strength  ?Baseline: no HEP established; reports independence with current HEP ?Goal status: MET ?  ?2.  Pt will improve FGA to >/= 23/30 to demonstrate improved balance and reduced fall risk ?Baseline: 21/30; 27/30 ?Goal status: MET ?  ?3. Pt will improve 5x STS to </= 12 sec to demo improved functional LE strength and balance  ?Baseline: 15.24 secs; 10.21 secs ?Goal status: MET ?  ?  ?  ?LONG TERM GOALS: Target date:  12/02/21 ?  ?Pt will be independent with final HEP for improved balance/strength ?Baseline: no HEP established ?Goal status: INITIAL ?  ?2.  Pt will improve FGA to >/= 26/30 to demonstrate improved balance and reduced fall risk ?Baseline: 21/30; 27/30 ?Goal status: INITIAL ?  ?3.  Patient will improve situation 4 of M-CTSIB to >/= 30 seconds without LOB ?Baseline: 22 seconds ?Goal status: INITIAL ?  ?4.  Pt will verbalize ability to complete fly fishing without any instance of imbalance or LOB noted ?Baseline: has not completed recently ?Goal status:  INITIAL ?  ?PLAN: ?PT FREQUENCY: 1x/week ?  ?PT DURATION: 6 weeks ?  ?PLANNED INTERVENTIONS: Therapeutic exercises, Therapeutic activity, Neuromuscular re-education, Balance training, Gait training, Patient/Famil

## 2021-11-09 DIAGNOSIS — E875 Hyperkalemia: Secondary | ICD-10-CM | POA: Diagnosis not present

## 2021-11-16 ENCOUNTER — Ambulatory Visit: Payer: PPO | Attending: Internal Medicine

## 2021-11-16 DIAGNOSIS — R2681 Unsteadiness on feet: Secondary | ICD-10-CM | POA: Diagnosis not present

## 2021-11-16 DIAGNOSIS — M6281 Muscle weakness (generalized): Secondary | ICD-10-CM | POA: Diagnosis not present

## 2021-11-16 DIAGNOSIS — R2689 Other abnormalities of gait and mobility: Secondary | ICD-10-CM | POA: Diagnosis not present

## 2021-11-16 NOTE — Therapy (Signed)
?OUTPATIENT PHYSICAL THERAPY TREATMENT NOTE ? ? ?Patient Name: Logan Gonzales ?MRN: [id number] ?DOB:10-24-1945, 76 y.o., male ?Today's Date: 11/16/2021 ? ?PCP: Deland Pretty, MD ?REFERRING PROVIDER: Deland Pretty, MD ? ?END OF SESSION:  ? PT End of Session - 11/16/21 0926   ? ? Visit Number 5   ? Number of Visits 7   ? Date for PT Re-Evaluation 12/02/21   ? Authorization Type [employer]   ? Progress Note Due on Visit 10   ? PT Start Time 0930   ? PT Stop Time 1014   ? PT Time Calculation (min) 44 min   ? Activity Tolerance Patient tolerated treatment well   ? Behavior During Therapy [facility] for tasks assessed/performed   ? ?  ?  ? ?  ? ? ?Past Medical History:  ?Diagnosis Date  ? Cancer [facility])   ? Coronary artery disease due to calcified coronary lesion   ? Diabetes mellitus without complication ([place])   ? Hyperlipidemia   ? Hypertension   ? S/P drug eluting coronary stent placement 11/25/2020  ? 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 ?Past Surgical History:  ?Procedure Laterality Date  ? BLADDER SURGERY    ? CARDIAC CATHETERIZATION    ? HERNIA REPAIR    ? LEFT HEART CATH AND CORONARY ANGIOGRAPHY N/A 07/06/2020  ? Procedure: LEFT HEART CATH AND CORONARY ANGIOGRAPHY;  Surgeon: Adrian Prows, MD;  Location: [place] CV LAB;  Service: Cardiovascular;  Laterality: N/A;  ? SHOULDER SURGERY Right   ? ?Patient Active Problem List  ? Diagnosis Date Noted  ? Dyspnea on exertion 07/05/2020  ? CAD in native artery 07/05/2020  ? DM (diabetes mellitus) ([place]) 07/21/2012  ? ? ?REFERRING DIAG: R26.89 (ICD-10-CM) - Balance disorder  ? ?THERAPY DIAG:  ?Unsteadiness on feet ? ?Muscle weakness (generalized) ? ?Other abnormalities of gait and mobility ? ?PERTINENT HISTORY: Cancer, CAD, DM, HLD, HTN  ? ?PRECAUTIONS: Fall  ? ?SUBJECTIVE: Patient reports no new changes/complaints. Have been feeling well. Reports no falls or stumbles. Continue to go to gym. Need to reschedule next weeks visit to later due to eye doctor appointment.  ? ?PAIN:  ?Are you  having pain? No ? ?OBJECTIVE:  ?  ?TODAY'S TREATMENT ?TherEx:  ?Completed SciFit with BUE/BLE on Level 6.0 x 3 minutes, with fatigue noted therefore reduced resistance to Level 3.0 for final 3 minutes for improved endurance/activity tolerance.  ? ?NMR: ?Tandem Gait: completed tandem gait on blue balance beam x 3 laps down and back forwards, then additional 2 laps added in backwards. Use of mirror, CGA and intermittent UE support required with backwards tandem.  ? ?Standing on firm surface in front of rebounder, completed SLS toss to rebounder, completed x 10 reps on each leg, increased challenge on RLE > LLE.  ? ?Tandem Stance on Airex: with EO 2 x 30 seconds, then with EC 2 x 30 seconds. Alternating foot position. Increased challenge with RLE posterior. ? ?Standing on Inverted BOSU: completed lateral and anterior/posterior weight shift without UE support x 15 reps each direction. Most challenge noted with lateral and to left > right. CGA throughout.  ? ?On BOSU (blue side up): completed alternating step up with opposite knee drive, completed x 15 reps bilat with 5 second hold for SLS.  ? ?Standing Balance: ?Surface: Airex ?Position: Narrow Base of Support ?Completed with: Eyes Closed; Head Turns x 10 Reps and Head Nods x 10 Reps, x 2 sets (one with EO, then EC) then completed without  head movement and eyes closed 3 x 30 seconds. Most challenge noted with EC. ? ?PATIENT EDUCATION: ?Education details: Continue HEP ?Person educated: Patient ?Education method: Explanation and handout  ?Education comprehension: verbalized understanding ? ?  HOME EXERCISE PROGRAM  ?Access Code: [email or web address] ? ?  ?ASSESSMENT: ?  ?CLINICAL IMPRESSION: ?Today's skilled PT session focused on continued balance exercises, with main focus on complaint surfaces eyes closed and SLS activities. Patient tolerating all activities well, will continue per POC.  ?  ?OBJECTIVE IMPAIRMENTS decreased activity tolerance, decreased balance, difficulty walking,  and decreased strength.  ?  ?ACTIVITY LIMITATIONS community activity, yard work, and Veterinary surgeon .  ?  ?PERSONAL FACTORS Age and 3+ comorbidities: Cancer, CAD, DM, HLD, HTN  are also affecting patient's functional outcome.  ?  ?  ?REHAB POTENTIAL: Excellent ?  ?CLINICAL DECISION MAKING: Stable/uncomplicated ?  ?EVALUATION COMPLEXITY: Low ?  ?  ?GOALS: ?Goals reviewed with patient? Yes ?  ?SHORT TERM GOALS: Target date:  11/07/21 ?  ?Pt will be independent with initial HEP for improved balance/strength  ?Baseline: no HEP established; reports independence with current HEP ?Goal status: MET ?  ?2.  Pt will improve FGA to >/= 23/30 to demonstrate improved balance and reduced fall risk ?Baseline: 21/30; 27/30 ?Goal status: MET ?  ?3. Pt will improve 5x STS to </= 12 sec to demo improved functional LE strength and balance  ?Baseline: 15.24 secs; 10.21 secs ?Goal status: MET ?  ?  ?  ?LONG TERM GOALS: Target date:  12/02/21 ?  ?Pt will be independent with final HEP for improved balance/strength ?Baseline: no HEP established ?Goal status: INITIAL ?  ?2.  Pt will improve FGA to >/= 26/30 to demonstrate improved balance and reduced fall risk ?Baseline: 21/30; 27/30 ?Goal status: INITIAL ?  ?3.  Patient will improve situation 4 of M-CTSIB to >/= 30 seconds without LOB ?Baseline: 22 seconds ?Goal status: INITIAL ?  ?4.  Pt will verbalize ability to complete fly fishing without any instance of imbalance or LOB noted ?Baseline: has not completed recently ?Goal status: INITIAL ?  ?PLAN: ?PT FREQUENCY: 1x/week ?  ?PT DURATION: 6 weeks ?  ?PLANNED INTERVENTIONS: Therapeutic exercises, Therapeutic activity, Neuromuscular re-education, Balance training, Gait training, Patient/Family education, Joint mobilization, Stair training, Vestibular training, and Manual therapy ?  ?PLAN FOR NEXT SESSION: Activities for SLS. Eyes Closed. Dynamic Balance. Alt. Walking lunges w/slam ball, rockerboard in front of rebounder, standing marches w/OH  hold, step ups to Bosu, add KB to single leg RDLs. May need to re-cert for few additional visits at end of cert to conitnue to work on dynamic balance ?  ? ? ?Jones Bales, PT, DPT ?11/16/21, 11:01 AM  ?   ?

## 2021-11-21 ENCOUNTER — Ambulatory Visit: Payer: PPO | Admitting: Physical Therapy

## 2021-11-21 DIAGNOSIS — H52223 Regular astigmatism, bilateral: Secondary | ICD-10-CM | POA: Diagnosis not present

## 2021-11-21 DIAGNOSIS — E113293 Type 2 diabetes mellitus with mild nonproliferative diabetic retinopathy without macular edema, bilateral: Secondary | ICD-10-CM | POA: Diagnosis not present

## 2021-11-21 DIAGNOSIS — H43813 Vitreous degeneration, bilateral: Secondary | ICD-10-CM | POA: Diagnosis not present

## 2021-11-21 DIAGNOSIS — H524 Presbyopia: Secondary | ICD-10-CM | POA: Diagnosis not present

## 2021-11-21 DIAGNOSIS — Z961 Presence of intraocular lens: Secondary | ICD-10-CM | POA: Diagnosis not present

## 2021-11-21 DIAGNOSIS — H02042 Spastic entropion of right lower eyelid: Secondary | ICD-10-CM | POA: Diagnosis not present

## 2021-11-21 DIAGNOSIS — H5213 Myopia, bilateral: Secondary | ICD-10-CM | POA: Diagnosis not present

## 2021-11-22 DIAGNOSIS — E782 Mixed hyperlipidemia: Secondary | ICD-10-CM | POA: Diagnosis not present

## 2021-11-22 DIAGNOSIS — Z955 Presence of coronary angioplasty implant and graft: Secondary | ICD-10-CM | POA: Diagnosis not present

## 2021-11-22 DIAGNOSIS — I10 Essential (primary) hypertension: Secondary | ICD-10-CM | POA: Diagnosis not present

## 2021-11-22 DIAGNOSIS — R0602 Shortness of breath: Secondary | ICD-10-CM | POA: Diagnosis not present

## 2021-11-22 DIAGNOSIS — I2510 Atherosclerotic heart disease of native coronary artery without angina pectoris: Secondary | ICD-10-CM | POA: Diagnosis not present

## 2021-11-23 ENCOUNTER — Ambulatory Visit: Payer: PPO | Admitting: Physical Therapy

## 2021-11-23 DIAGNOSIS — Z125 Encounter for screening for malignant neoplasm of prostate: Secondary | ICD-10-CM | POA: Diagnosis not present

## 2021-11-23 DIAGNOSIS — R2689 Other abnormalities of gait and mobility: Secondary | ICD-10-CM

## 2021-11-23 DIAGNOSIS — R2681 Unsteadiness on feet: Secondary | ICD-10-CM

## 2021-11-23 DIAGNOSIS — E118 Type 2 diabetes mellitus with unspecified complications: Secondary | ICD-10-CM | POA: Diagnosis not present

## 2021-11-23 DIAGNOSIS — I10 Essential (primary) hypertension: Secondary | ICD-10-CM | POA: Diagnosis not present

## 2021-11-23 NOTE — Therapy (Signed)
?OUTPATIENT PHYSICAL THERAPY TREATMENT NOTE ? ? ?Patient Name: Logan Gonzales ?MRN: [id number] ?DOB:[birth year], 76 y.o., male ?Today's Date: 11/23/2021 ? ?PCP: Deland Pretty, MD ?REFERRING PROVIDER: Deland Pretty, MD ? ?END OF SESSION:  ? PT End of Session - 11/23/21 1450   ? ? Visit Number 6   ? Number of Visits 7   ? Date for PT Re-Evaluation 12/02/21   ? Authorization Type [employer]   ? Progress Note Due on Visit 10   ? PT Start Time 1450   ? PT Stop Time 1526   ? PT Time Calculation (min) 36 min   ? Activity Tolerance Patient tolerated treatment well   ? Behavior During Therapy [facility] for tasks assessed/performed   ? ?  ?  ? ?  ? ? ?Past Medical History:  ?Diagnosis Date  ? Cancer [facility])   ? Coronary artery disease due to calcified coronary lesion   ? Diabetes mellitus without complication ([place])   ? Hyperlipidemia   ? Hypertension   ? S/P drug eluting coronary stent placement 11/25/2020  ? 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 ?Past Surgical History:  ?Procedure Laterality Date  ? BLADDER SURGERY    ? CARDIAC CATHETERIZATION    ? HERNIA REPAIR    ? LEFT HEART CATH AND CORONARY ANGIOGRAPHY N/A 07/06/2020  ? Procedure: LEFT HEART CATH AND CORONARY ANGIOGRAPHY;  Surgeon: Adrian Prows, MD;  Location: [place] CV [facility];  Service: Cardiovascular;  Laterality: N/A;  ? SHOULDER SURGERY Right   ? ?Patient Active Problem List  ? Diagnosis Date Noted  ? Dyspnea on exertion 07/05/2020  ? CAD in native artery 07/05/2020  ? DM (diabetes mellitus) ([place]) 07/21/2012  ? ? ?REFERRING DIAG: R26.89 (ICD-10-CM) - Balance disorder  ? ?THERAPY DIAG:  ?Unsteadiness on feet ? ?Other abnormalities of gait and mobility ? ?PERTINENT HISTORY: Cancer, CAD, DM, HLD, HTN  ? ?PRECAUTIONS: Fall  ? ?SUBJECTIVE: Pt reports he has been performing 150 min/wk of brisk walking and doing strength training 3x/wk. Has not been as diligent w/HEP but trying to integrate exercises into strength training.  ? ?PAIN:  ?Are you having pain? No ? ?OBJECTIVE:  ?   ?TODAY'S TREATMENT ?TherEx:  ?Elliptical level 2 for 8 minutes (4 minutes fwd and 4 retro) for improved BLE strength and dynamic cardiovascular warmup.  ? ?In // bars for improved dynamic standing balance, single leg stability and core stability:  ?-On Bosu black side up, pt perfomred x12 squats w/intermittent UE support. Min cues to maintain upright posture. Noted lateral drift to R side throughout.  ?-Standing alt march w/10# KB held [facility], emphasis on 3s isometric hold for improved single leg stance. Pt performed 2x5 per side w/intermittent UE support on rail. ? ?Added Bosu ball exercises from previous session to HEP along w/exercises from today (See bolded below).  ? ? ? ?PATIENT EDUCATION: ?Education details: Updates to HEP, DC plan for next week  ?Person educated: Patient ?Education method: Explanation and handout  ?Education comprehension: verbalized understanding ? ?  HOME EXERCISE PROGRAM  ?Access Code: [email or web address] ?URL: [email or web address] ?Date: 11/23/2021 ?Prepared by: Mickie Bail Montario Zilka ? ?Exercises ?- Forward Step Down Touch with Heel  - 1 x daily - 7 x weekly - 3 sets - 10 reps ?- Backward heel to toe walking   - 1 x daily - 7 x weekly - 3 sets - 10 reps ?- Heel to toe walking   - 1 x daily - 7 x weekly - 3 sets -  10 reps ?- Standing Single Leg Stance with Counter Support  - 1 x daily - 7 x weekly - 3 sets - 10 reps ?- Single leg deadlift   - 1 x daily - 7 x weekly - 3 sets - 10 reps ?- Romberg Stance Eyes Closed on Foam Pad  - 1 x daily - 7 x weekly - 1 sets - 3 reps - 30 seconds hold ?- Romberg Stance on Foam Pad with Head Rotation with Eyes Closed  - 1 x daily - 7 x weekly - 1 sets - 10 reps ?- Romberg Stance with Head Nods on Foam Pad with Eyes Closed  - 1 x daily - 7 x weekly - 1 sets - 10 reps ?- Runner's Step Up on BOSU? Ball  - 1 x daily - 7 x weekly - 3 sets - 10 reps ?- Standing Feet Apart Firm Surface of Bosu Ball in [employer] With Eyes Open  - 1 x daily - 7 x weekly - 3 sets - 10  reps ?- Standing march with overhead hold   - 1 x daily - 7 x weekly - 3 sets - 10 reps ? ?  ?ASSESSMENT: ?  ?CLINICAL IMPRESSION: ?Emphasis of skilled PT session on discussing DC plan, single leg stability and dynamic balance. Pt in agreement to DC from therapy next week due to feeling as though he has been provided "all the tools" he needs and must begin integrating exercises into his daily routine. Pt fatigued quickly during session but performed exercises well. Noted increased LOB to R side compared to L. Continue POC.  ?  ?OBJECTIVE IMPAIRMENTS decreased activity tolerance, decreased balance, difficulty walking, and decreased strength.  ?  ?ACTIVITY LIMITATIONS community activity, yard work, and Veterinary surgeon .  ?  ?PERSONAL FACTORS Age and 3+ comorbidities: Cancer, CAD, DM, HLD, HTN  are also affecting patient's functional outcome.  ?  ?  ?REHAB POTENTIAL: Excellent ?  ?CLINICAL DECISION MAKING: Stable/uncomplicated ?  ?EVALUATION COMPLEXITY: Low ?  ?  ?GOALS: ?Goals reviewed with patient? Yes ?  ?SHORT TERM GOALS: Target date:  11/07/21 ?  ?Pt will be independent with initial HEP for improved balance/strength  ?Baseline: no HEP established; reports independence with current HEP ?Goal status: MET ?  ?2.  Pt will improve FGA to >/= 23/30 to demonstrate improved balance and reduced fall risk ?Baseline: 21/30; 27/30 ?Goal status: MET ?  ?3. Pt will improve 5x STS to </= 12 sec to demo improved functional LE strength and balance  ?Baseline: 15.24 secs; 10.21 secs ?Goal status: MET ?  ?  ?  ?LONG TERM GOALS: Target date:  12/02/21 ?  ?Pt will be independent with final HEP for improved balance/strength ?Baseline: no HEP established ?Goal status: INITIAL ?  ?2.  Pt will improve FGA to >/= 26/30 to demonstrate improved balance and reduced fall risk ?Baseline: 21/30; 27/30 ?Goal status: INITIAL ?  ?3.  Patient will improve situation 4 of M-CTSIB to >/= 30 seconds without LOB ?Baseline: 22 seconds ?Goal status: INITIAL ?   ?4.  Pt will verbalize ability to complete fly fishing without any instance of imbalance or LOB noted ?Baseline: has not completed recently ?Goal status: INITIAL ?  ?PLAN: ?PT FREQUENCY: 1x/week ?  ?PT DURATION: 6 weeks ?  ?PLANNED INTERVENTIONS: Therapeutic exercises, Therapeutic activity, Neuromuscular re-education, Balance training, Gait training, Patient/Family education, Joint mobilization, Stair training, Vestibular training, and Manual therapy ?  ?PLAN FOR NEXT SESSION: Plan to DC next visit. Assess Goals.  Activities for SLS. Eyes Closed. Dynamic Balance. Alt. Walking lunges w/slam ball, rockerboard in front of rebounder, standing marches w/OH hold, step ups to Bosu, add KB to single leg RDLs. May need to re-cert for few additional visits at end of cert to conitnue to work on dynamic balance ?  ? ? ?Cruzita Lederer Fox Salminen, PT, DPT ?11/23/21, 3:27 PM  ?   ?

## 2021-11-25 NOTE — Progress Notes (Incomplete)
Patient Name: Logan Gonzales ?MRN: [id number] ?DOB:05/16/1946, 76 y.o., male ?Today's Date: 11/25/2021 ? ?Testing....testing....testing... ? ? ?Kerrie Pleasure, PT ?11/25/2021, 4:06 PM ? ?

## 2021-11-28 ENCOUNTER — Ambulatory Visit: Payer: PPO | Admitting: Physical Therapy

## 2021-11-28 DIAGNOSIS — R2681 Unsteadiness on feet: Secondary | ICD-10-CM

## 2021-11-28 NOTE — Therapy (Signed)
?OUTPATIENT PHYSICAL THERAPY TREATMENT NOTE- DISCHARGE SUMMARY ? ? ?Patient Name: Logan Gonzales ?MRN: [id number] ?DOB:Jul 24, 1945, 76 y.o., male ?Today's Date: 11/28/2021 ? ?PCP: Deland Pretty, MD ?REFERRING PROVIDER: Deland Pretty, MD ? ?PHYSICAL THERAPY DISCHARGE SUMMARY ? ?Visits from Start of Care: 7 ? ?Current functional level related to goals / functional outcomes: ?See below  ?  ?Remaining deficits: ?Mild imbalance 2/2 age  ?  ?Education / Equipment: ?HEP  ? ?Patient agrees to discharge. Patient goals were met. Patient is being discharged due to meeting the stated rehab goals and being pleased with current functional level. ? ? ?END OF SESSION:  ? PT End of Session - 11/28/21 0936   ? ? Visit Number 7   ? Number of Visits 7   ? Date for PT Re-Evaluation 12/02/21   ? Authorization Type [employer]   ? Progress Note Due on Visit 10   ? PT Start Time 9518   ? PT Stop Time [phone]   Discharge from PT  ? PT Time Calculation (min) 16 min   ? Activity Tolerance Patient tolerated treatment well   ? Behavior During Therapy [facility] for tasks assessed/performed   ? ?  ?  ? ?  ? ? ?Past Medical History:  ?Diagnosis Date  ? Cancer [facility])   ? Coronary artery disease due to calcified coronary lesion   ? Diabetes mellitus without complication ([place])   ? Hyperlipidemia   ? Hypertension   ? S/P drug eluting coronary stent placement 11/25/2020  ? 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 ?Past Surgical History:  ?Procedure Laterality Date  ? BLADDER SURGERY    ? CARDIAC CATHETERIZATION    ? HERNIA REPAIR    ? LEFT HEART CATH AND CORONARY ANGIOGRAPHY N/A 07/06/2020  ? Procedure: LEFT HEART CATH AND CORONARY ANGIOGRAPHY;  Surgeon: Adrian Prows, MD;  Location: [facility];  Service: Cardiovascular;  Laterality: N/A;  ? SHOULDER SURGERY Right   ? ?Patient Active Problem List  ? Diagnosis Date Noted  ? Dyspnea on exertion 07/05/2020  ? CAD in native artery 07/05/2020  ? DM (diabetes mellitus) ([place]) 07/21/2012  ? ? ?REFERRING DIAG: R26.89  (ICD-10-CM) - Balance disorder  ? ?THERAPY DIAG:  ?Unsteadiness on feet ? ?PERTINENT HISTORY: Cancer, CAD, DM, HLD, HTN  ? ?PRECAUTIONS: Fall  ? ?SUBJECTIVE: Pt reports he took this weekend off from exercise due to being tired from all the workouts last week. Pt feels good about DC from PT today.  ? ?PAIN:  ?Are you having pain? No ? ?OBJECTIVE:  ?  ?TODAY'S TREATMENT ?There Act  ?LTG assessment  ? [facility] PT Assessment - 11/28/21 0938   ? ?  ? Functional Gait  Assessment  ? Gait assessed  Yes   ? Gait Level Surface Walks 20 ft in less than 5.5 sec, no assistive devices, good speed, no evidence for imbalance, normal gait pattern, deviates no more than 6 in outside of the 12 in walkway width.   5.12s  ? Change in Gait Speed Able to smoothly change walking speed without loss of balance or gait deviation. Deviate no more than 6 in outside of the 12 in walkway width.   ? Gait with Horizontal Head Turns Performs head turns smoothly with no change in gait. Deviates no more than 6 in outside 12 in walkway width   ? Gait with Vertical Head Turns Performs head turns with no change in gait. Deviates no more than 6 in outside 12 in walkway  width.   ? Gait and Pivot Turn Pivot turns safely within 3 sec and stops quickly with no loss of balance.   ? Step Over Obstacle Is able to step over 2 stacked shoe boxes taped together (9 in total height) without changing gait speed. No evidence of imbalance.   ? Gait with Narrow Base of Support Is able to ambulate for 10 steps heel to toe with no staggering.   ? Gait with Eyes Closed Walks 20 ft, no assistive devices, good speed, no evidence of imbalance, normal gait pattern, deviates no more than 6 in outside 12 in walkway width. Ambulates 20 ft in less than 7 sec.   5.85s  ? Ambulating Backwards Walks 20 ft, no assistive devices, good speed, no evidence for imbalance, normal gait   ? Steps Alternating feet, no rail.   ? Total Score 30   ? FGA comment: Perfect score   ? ?  ?  ? ?  ? ?MCTSIB  condition 4: 40s  ?Standing on foam w/narrow BOS and EC: 30s  ? ? ? ?PATIENT EDUCATION: ?Education details: Obtaining new referral for PT if balance changes in future, performing HEP regularly for continued functional gains  ?Person educated: Patient ?Education method: Explanation  ?Education comprehension: verbalized understanding ? ?  HOME EXERCISE PROGRAM  ?Access Code: [email or web address] ?URL: [email or web address] ?Date: 11/23/2021 ?Prepared by: Mickie Bail Kashis Penley ? ?Exercises ?- Forward Step Down Touch with Heel  - 1 x daily - 7 x weekly - 3 sets - 10 reps ?- Backward heel to toe walking   - 1 x daily - 7 x weekly - 3 sets - 10 reps ?- Heel to toe walking   - 1 x daily - 7 x weekly - 3 sets - 10 reps ?- Standing Single Leg Stance with Counter Support  - 1 x daily - 7 x weekly - 3 sets - 10 reps ?- Single leg deadlift   - 1 x daily - 7 x weekly - 3 sets - 10 reps ?- Romberg Stance Eyes Closed on Foam Pad  - 1 x daily - 7 x weekly - 1 sets - 3 reps - 30 seconds hold ?- Romberg Stance on Foam Pad with Head Rotation with Eyes Closed  - 1 x daily - 7 x weekly - 1 sets - 10 reps ?- Romberg Stance with Head Nods on Foam Pad with Eyes Closed  - 1 x daily - 7 x weekly - 1 sets - 10 reps ?- Runner's Step Up on BOSU? Ball  - 1 x daily - 7 x weekly - 3 sets - 10 reps ?- Standing Feet Apart Firm Surface of Bosu Ball in [employer] With Eyes Open  - 1 x daily - 7 x weekly - 3 sets - 10 reps ?- Standing march with overhead hold   - 1 x daily - 7 x weekly - 3 sets - 10 reps ? ?  ?ASSESSMENT: ?  ?CLINICAL IMPRESSION: ?Emphasis of skilled PT session on DC from PT and assessing LTGs. Pt has met 3 of 4 LTGs, achieving a perfect score on FGA and holding condition 4 of mCTSIB for 40s without sway or LOB. Pt has not yet attempted fly fishing, so his final LTG could not be assessed. Pt very satisfied with the progress he has made in PT and feels ready to DC so he can prioritize caring for his wife. Informed pt on how to obtain new PT  referral in future if he balance needs  change, pt verbalized understanding.  ?  ?OBJECTIVE IMPAIRMENTS decreased activity tolerance, decreased balance, difficulty walking, and decreased strength.  ?  ?ACTIVITY LIMITATIONS community activity, yard work, and Veterinary surgeon .  ?  ?PERSONAL FACTORS Age and 3+ comorbidities: Cancer, CAD, DM, HLD, HTN  are also affecting patient's functional outcome.  ?  ?  ?REHAB POTENTIAL: Excellent ?  ?CLINICAL DECISION MAKING: Stable/uncomplicated ?  ?EVALUATION COMPLEXITY: Low ?  ?  ?GOALS: ?Goals reviewed with patient? Yes ?  ?SHORT TERM GOALS: Target date:  11/07/21 ?  ?Pt will be independent with initial HEP for improved balance/strength  ?Baseline: no HEP established; reports independence with current HEP ?Goal status: MET ?  ?2.  Pt will improve FGA to >/= 23/30 to demonstrate improved balance and reduced fall risk ?Baseline: 21/30; 27/30 ?Goal status: MET ?  ?3. Pt will improve 5x STS to </= 12 sec to demo improved functional LE strength and balance  ?Baseline: 15.24 secs; 10.21 secs ?Goal status: MET ?  ?  ?  ?LONG TERM GOALS: Target date:  12/02/21 ?  ?Pt will be independent with final HEP for improved balance/strength ?Baseline: no HEP established ?Goal status: MET  ?  ?2.  Pt will improve FGA to >/= 26/30 to demonstrate improved balance and reduced fall risk ?Baseline: 21/30; 27/30; 30/30 on 11/28/21  ?Goal status: MET  ?  ?3.  Patient will improve situation 4 of M-CTSIB to >/= 30 seconds without LOB ?Baseline: 22 seconds; 40 seconds on 11/28/21  ?Goal status: MET ?  ?4.  Pt will verbalize ability to complete fly fishing without any instance of imbalance or LOB noted ?Baseline: has not completed recently ?Goal status: NOT MET - pt has not been fly fishing throughout therapy  ?  ?PLAN: ?PT FREQUENCY: 1x/week ?  ?PT DURATION: 6 weeks ?  ?PLANNED INTERVENTIONS: Therapeutic exercises, Therapeutic activity, Neuromuscular re-education, Balance training, Gait training, Patient/Family  education, Joint mobilization, Stair training, Vestibular training, and Manual therapy ? ?  ?Cruzita Lederer Chrishawn Kring, PT, DPT ?11/28/21, 9:59 AM  ?   ?

## 2021-11-30 DIAGNOSIS — I10 Essential (primary) hypertension: Secondary | ICD-10-CM | POA: Diagnosis not present

## 2021-11-30 DIAGNOSIS — Z9861 Coronary angioplasty status: Secondary | ICD-10-CM | POA: Diagnosis not present

## 2021-11-30 DIAGNOSIS — I2510 Atherosclerotic heart disease of native coronary artery without angina pectoris: Secondary | ICD-10-CM | POA: Diagnosis not present

## 2021-11-30 DIAGNOSIS — I700 Atherosclerosis of aorta: Secondary | ICD-10-CM | POA: Diagnosis not present

## 2021-11-30 DIAGNOSIS — R21 Rash and other nonspecific skin eruption: Secondary | ICD-10-CM | POA: Diagnosis not present

## 2021-11-30 DIAGNOSIS — Z0000 Encounter for general adult medical examination without abnormal findings: Secondary | ICD-10-CM | POA: Diagnosis not present

## 2021-11-30 DIAGNOSIS — E118 Type 2 diabetes mellitus with unspecified complications: Secondary | ICD-10-CM | POA: Diagnosis not present

## 2021-12-05 DIAGNOSIS — Z8679 Personal history of other diseases of the circulatory system: Secondary | ICD-10-CM | POA: Diagnosis not present

## 2021-12-05 DIAGNOSIS — Z7901 Long term (current) use of anticoagulants: Secondary | ICD-10-CM | POA: Diagnosis not present

## 2021-12-05 DIAGNOSIS — Z86010 Personal history of colonic polyps: Secondary | ICD-10-CM | POA: Diagnosis not present

## 2022-01-11 DIAGNOSIS — Z8551 Personal history of malignant neoplasm of bladder: Secondary | ICD-10-CM | POA: Diagnosis not present

## 2022-02-14 DEATH — deceased

## 2022-03-06 IMAGING — MR MR LUMBAR SPINE W/O CM
4 of 5 series · 18 of 48 positions shown · non-contrast
Comparison: MRI lumbar spine 03/27/2007

CLINICAL DATA: Low back pain.  No prior back surgery.

EXAM:
MRI LUMBAR SPINE WITHOUT CONTRAST
TECHNIQUE: Multiplanar, multisequence MR imaging of the lumbar spine was
performed. No intravenous contrast was administered.

[Series 5: T2 · sagittal · 4.0mm · 0.73mm/px · 6 of 15 slices shown (1 of 2)]
[im 1/15]
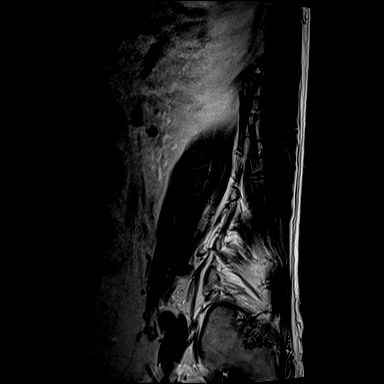
[im 3/15]
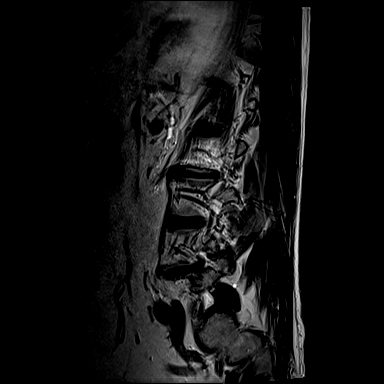
[im 6/15]
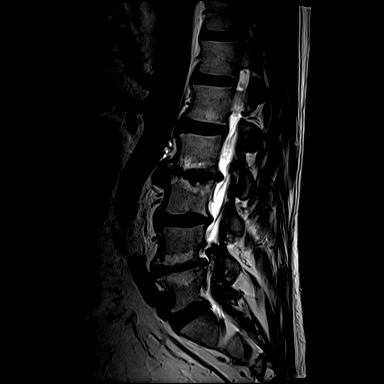
[im 9/15]
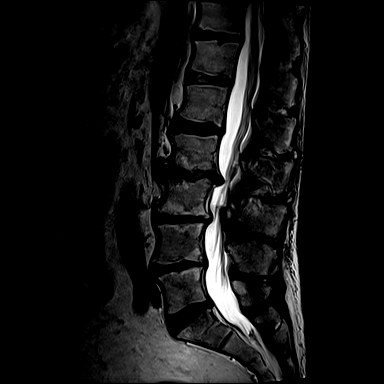
[im 12/15]
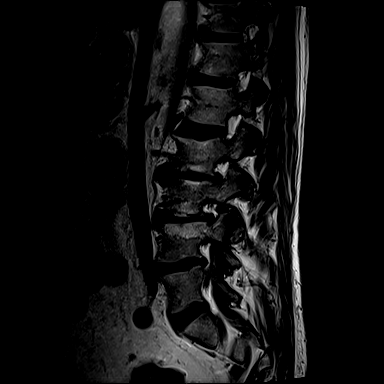
[im 15/15]
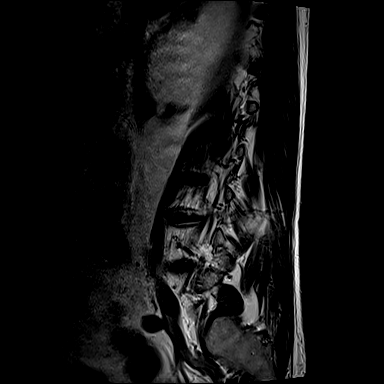

[Series 6: T1 · sagittal · 4.0mm · 0.73mm/px · 3 of 15 slices shown (1 of 2)]
[im 3/15]
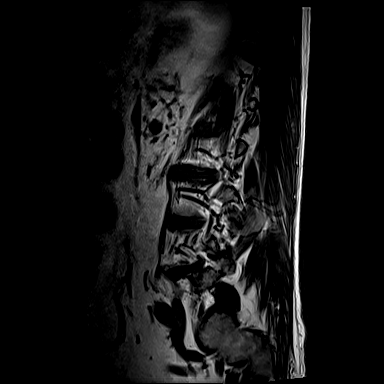
[im 9/15]
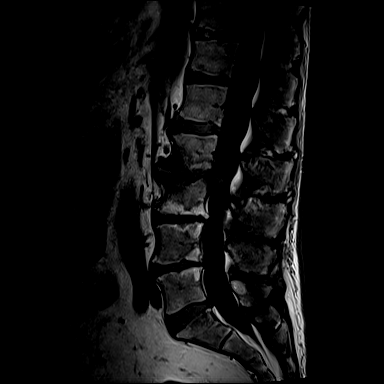
[im 15/15]
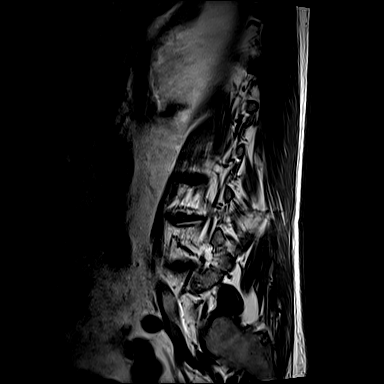

[Series 10: T1 · axial · 4.0mm · 0.28mm/px · z∈[-55,+110]mm · 3 of 40 slices shown (2 of 2)]
[im 6/40]
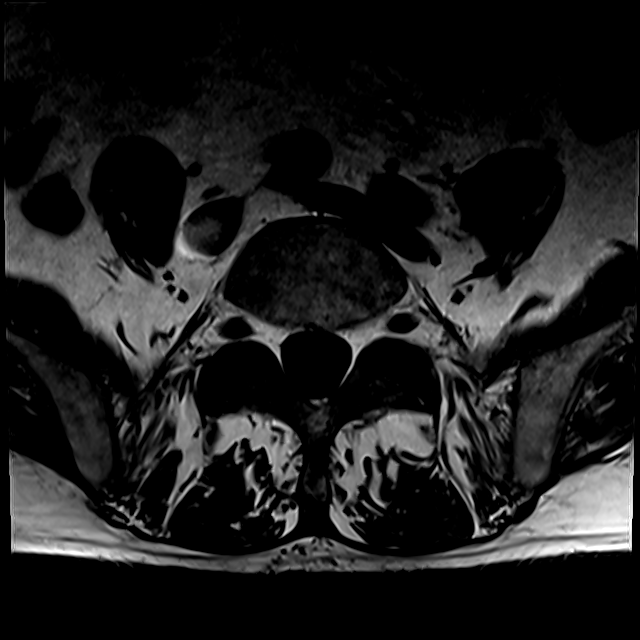
[im 20/40]
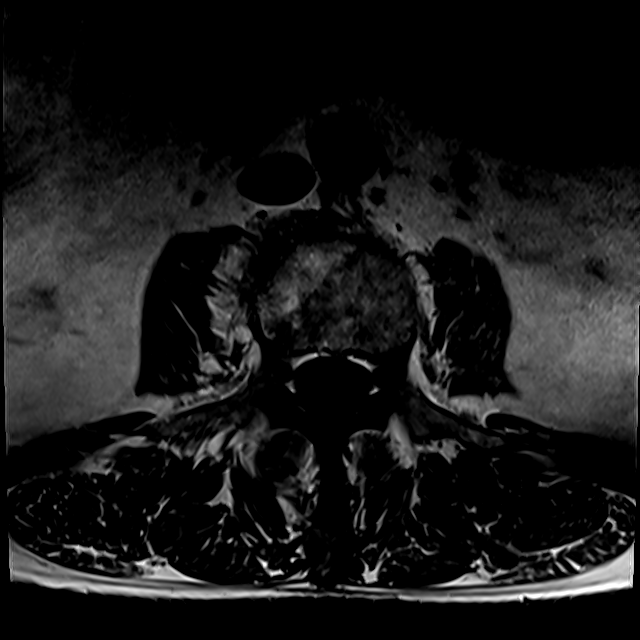
[im 34/40]
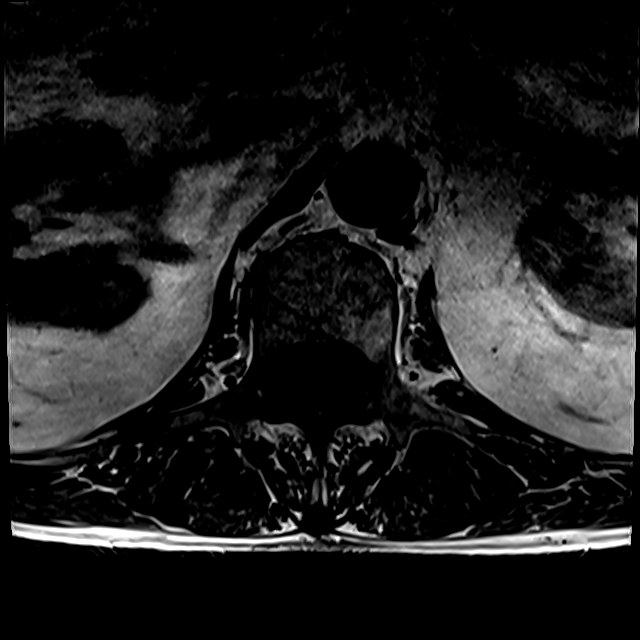

[Series 13: T2 · axial · 4.0mm · 0.28mm/px · z∈[-79,+110]mm · 6 of 40 slices shown (2 of 2)]
[im 1/40]
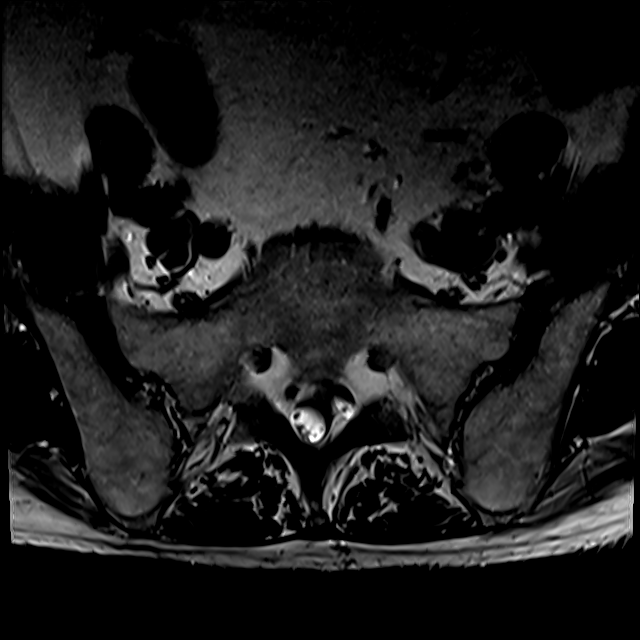
[im 6/40]
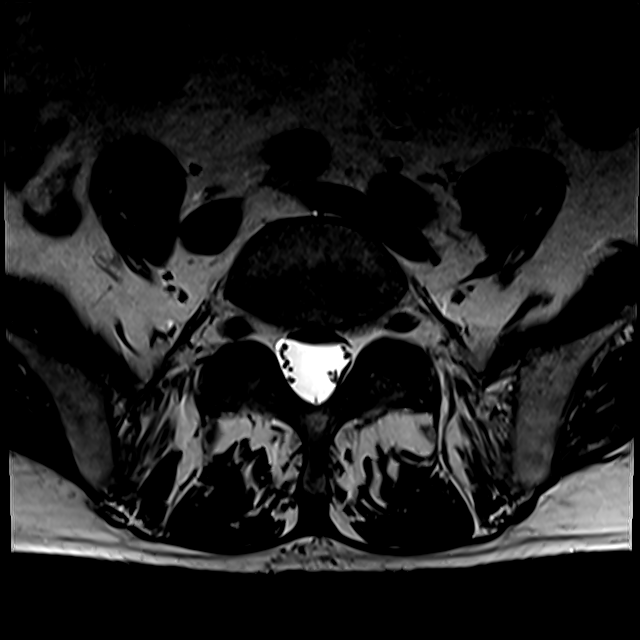
[im 12/40]
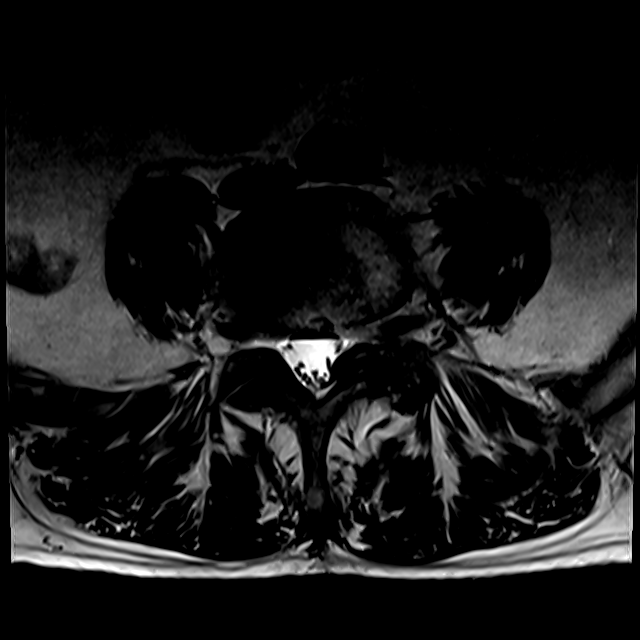
[im 17/40]
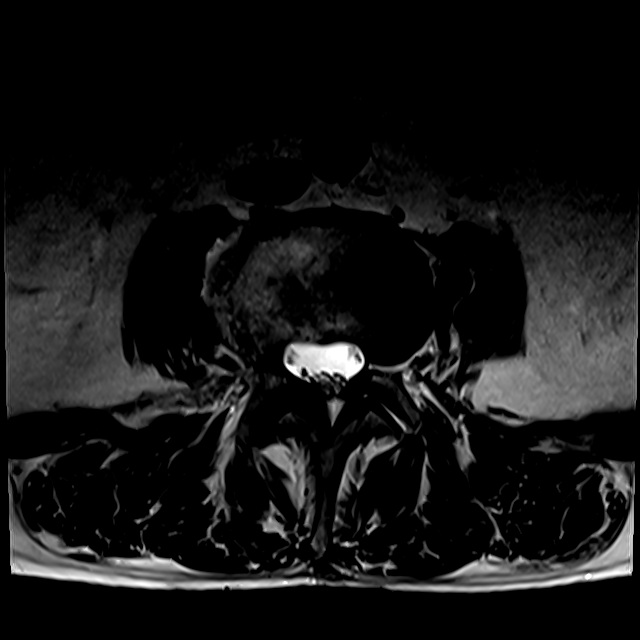
[im 20/40]
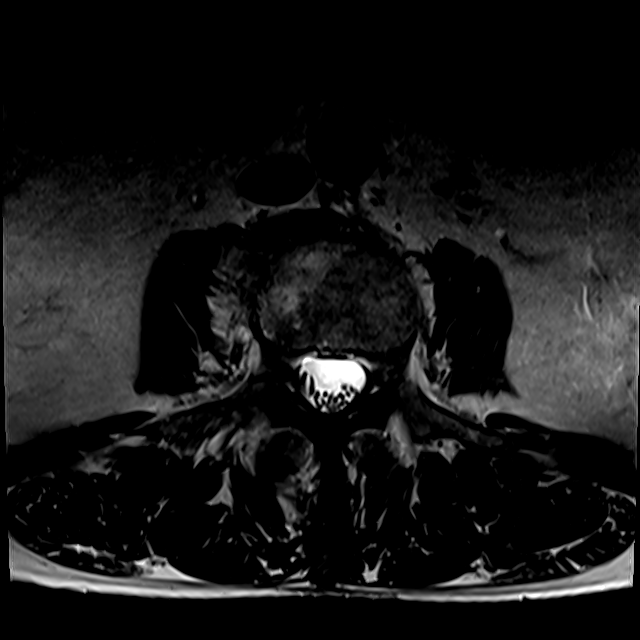
[im 34/40]
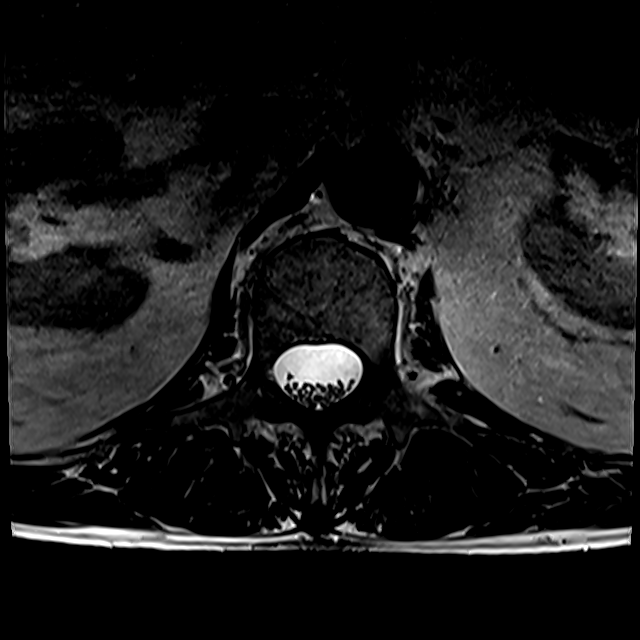

[18 of 48 positions shown; findings below may reference images not displayed]

FINDINGS: Segmentation:  Normal

Alignment:  Mild retrolisthesis L2-3 and L3-4

Vertebrae: Negative for fracture or mass. Extensive bone marrow
changes in the endplates at L2-3 related to advanced disc
degeneration which has progressed in the interval. Fatty endplate
changes on the right L3-4 similar on the right

to the prior study. Fatty endplate changes on the left at L4-5 also
have progressed.

Conus medullaris and cauda equina: Conus extends to the L1-2 level.
Conus and cauda equina appear normal.

Paraspinal and other soft tissues: Negative for paraspinous mass or
adenopathy or fluid collection.

Disc levels:

T12-L1: Mild disc degeneration without stenosis.

L1-2: Mild disc degeneration without stenosis

L2-3: Advanced disc degeneration with disc space narrowing and
extensive endplate spurring. Broad-based disc protrusion causing the
mild to moderate spinal stenosis with progression from the prior
study. Mild facet degeneration and mild subarticular stenosis
bilaterally

L3-4: Asymmetric disc degeneration on the right with disc space
narrowing and spurring on the right. Bilateral facet hypertrophy.
Moderate subarticular stenosis on the right. Mild subarticular
stenosis on the left.

L4-5: Asymmetric disc degeneration on the left with disc space
narrowing and spurring. Asymmetric facet degeneration on the left.
Moderate subarticular and foraminal stenosis on the left due to
spurring. Spinal canal adequate in size.

L5-S1: Small central disc protrusion. Negative for neural
impingement.
IMPRESSION: 1. Progressive disc degeneration at L2-3 with extensive endplate
spurring and broad-based central disc protrusion. Mild to moderate
spinal stenosis and mild subarticular stenosis bilaterally.
2. Asymmetric disc degeneration on the right at L3-4 with moderate
subarticular stenosis on the right and mild subarticular stenosis on
the left
3. Asymmetric disc degeneration on the left at L4-5 with moderate
subarticular and foraminal stenosis on the left due to spurring.

## 2022-03-14 DIAGNOSIS — Z9861 Coronary angioplasty status: Secondary | ICD-10-CM | POA: Diagnosis not present

## 2022-03-14 DIAGNOSIS — E7800 Pure hypercholesterolemia, unspecified: Secondary | ICD-10-CM | POA: Diagnosis not present

## 2022-03-14 DIAGNOSIS — E1129 Type 2 diabetes mellitus with other diabetic kidney complication: Secondary | ICD-10-CM | POA: Diagnosis not present

## 2022-03-14 DIAGNOSIS — R809 Proteinuria, unspecified: Secondary | ICD-10-CM | POA: Diagnosis not present

## 2022-03-14 DIAGNOSIS — Z79899 Other long term (current) drug therapy: Secondary | ICD-10-CM | POA: Diagnosis not present

## 2022-03-14 DIAGNOSIS — I10 Essential (primary) hypertension: Secondary | ICD-10-CM | POA: Diagnosis not present

## 2022-04-13 DIAGNOSIS — H16213 Exposure keratoconjunctivitis, bilateral: Secondary | ICD-10-CM | POA: Diagnosis not present

## 2022-04-13 DIAGNOSIS — Z961 Presence of intraocular lens: Secondary | ICD-10-CM | POA: Diagnosis not present

## 2022-04-13 DIAGNOSIS — H02032 Senile entropion of right lower eyelid: Secondary | ICD-10-CM | POA: Diagnosis not present

## 2022-04-13 DIAGNOSIS — H02532 Eyelid retraction right lower eyelid: Secondary | ICD-10-CM | POA: Diagnosis not present

## 2022-04-13 DIAGNOSIS — H02042 Spastic entropion of right lower eyelid: Secondary | ICD-10-CM | POA: Diagnosis not present

## 2022-04-13 DIAGNOSIS — G245 Blepharospasm: Secondary | ICD-10-CM | POA: Diagnosis not present

## 2022-04-13 DIAGNOSIS — H0279 Other degenerative disorders of eyelid and periocular area: Secondary | ICD-10-CM | POA: Diagnosis not present

## 2022-04-13 DIAGNOSIS — H02135 Senile ectropion of left lower eyelid: Secondary | ICD-10-CM | POA: Diagnosis not present

## 2022-04-13 DIAGNOSIS — H02535 Eyelid retraction left lower eyelid: Secondary | ICD-10-CM | POA: Diagnosis not present

## 2022-04-14 DIAGNOSIS — Z23 Encounter for immunization: Secondary | ICD-10-CM | POA: Diagnosis not present

## 2022-05-10 DIAGNOSIS — I10 Essential (primary) hypertension: Secondary | ICD-10-CM | POA: Diagnosis not present

## 2022-05-10 DIAGNOSIS — E782 Mixed hyperlipidemia: Secondary | ICD-10-CM | POA: Diagnosis not present

## 2022-05-10 DIAGNOSIS — I2582 Chronic total occlusion of coronary artery: Secondary | ICD-10-CM | POA: Diagnosis not present

## 2022-05-10 DIAGNOSIS — Z955 Presence of coronary angioplasty implant and graft: Secondary | ICD-10-CM | POA: Diagnosis not present

## 2022-05-10 DIAGNOSIS — I2510 Atherosclerotic heart disease of native coronary artery without angina pectoris: Secondary | ICD-10-CM | POA: Diagnosis not present

## 2022-05-17 DIAGNOSIS — H0279 Other degenerative disorders of eyelid and periocular area: Secondary | ICD-10-CM | POA: Diagnosis not present

## 2022-05-17 DIAGNOSIS — H02535 Eyelid retraction left lower eyelid: Secondary | ICD-10-CM | POA: Diagnosis not present

## 2022-05-17 DIAGNOSIS — G245 Blepharospasm: Secondary | ICD-10-CM | POA: Diagnosis not present

## 2022-05-17 DIAGNOSIS — H02032 Senile entropion of right lower eyelid: Secondary | ICD-10-CM | POA: Diagnosis not present

## 2022-05-17 DIAGNOSIS — H16213 Exposure keratoconjunctivitis, bilateral: Secondary | ICD-10-CM | POA: Diagnosis not present

## 2022-05-17 DIAGNOSIS — H02135 Senile ectropion of left lower eyelid: Secondary | ICD-10-CM | POA: Diagnosis not present

## 2022-05-17 DIAGNOSIS — Z961 Presence of intraocular lens: Secondary | ICD-10-CM | POA: Diagnosis not present

## 2022-05-17 DIAGNOSIS — H02042 Spastic entropion of right lower eyelid: Secondary | ICD-10-CM | POA: Diagnosis not present

## 2022-05-17 DIAGNOSIS — H02532 Eyelid retraction right lower eyelid: Secondary | ICD-10-CM | POA: Diagnosis not present

## 2022-05-24 DIAGNOSIS — E113393 Type 2 diabetes mellitus with moderate nonproliferative diabetic retinopathy without macular edema, bilateral: Secondary | ICD-10-CM | POA: Diagnosis not present

## 2022-05-24 DIAGNOSIS — H26491 Other secondary cataract, right eye: Secondary | ICD-10-CM | POA: Diagnosis not present

## 2022-05-24 DIAGNOSIS — H43813 Vitreous degeneration, bilateral: Secondary | ICD-10-CM | POA: Diagnosis not present

## 2022-05-24 DIAGNOSIS — H02042 Spastic entropion of right lower eyelid: Secondary | ICD-10-CM | POA: Diagnosis not present

## 2022-06-07 DIAGNOSIS — Z9861 Coronary angioplasty status: Secondary | ICD-10-CM | POA: Diagnosis not present

## 2022-06-07 DIAGNOSIS — E1129 Type 2 diabetes mellitus with other diabetic kidney complication: Secondary | ICD-10-CM | POA: Diagnosis not present

## 2022-06-07 DIAGNOSIS — I10 Essential (primary) hypertension: Secondary | ICD-10-CM | POA: Diagnosis not present

## 2022-06-07 DIAGNOSIS — Z79899 Other long term (current) drug therapy: Secondary | ICD-10-CM | POA: Diagnosis not present

## 2022-06-15 DIAGNOSIS — I2510 Atherosclerotic heart disease of native coronary artery without angina pectoris: Secondary | ICD-10-CM | POA: Diagnosis not present

## 2022-06-15 DIAGNOSIS — E7800 Pure hypercholesterolemia, unspecified: Secondary | ICD-10-CM | POA: Diagnosis not present

## 2022-06-15 DIAGNOSIS — E1129 Type 2 diabetes mellitus with other diabetic kidney complication: Secondary | ICD-10-CM | POA: Diagnosis not present

## 2022-06-15 DIAGNOSIS — Z79899 Other long term (current) drug therapy: Secondary | ICD-10-CM | POA: Diagnosis not present

## 2022-06-15 DIAGNOSIS — Z9861 Coronary angioplasty status: Secondary | ICD-10-CM | POA: Diagnosis not present

## 2022-06-15 DIAGNOSIS — I10 Essential (primary) hypertension: Secondary | ICD-10-CM | POA: Diagnosis not present

## 2022-06-30 DIAGNOSIS — I2510 Atherosclerotic heart disease of native coronary artery without angina pectoris: Secondary | ICD-10-CM | POA: Diagnosis not present

## 2022-06-30 DIAGNOSIS — Z955 Presence of coronary angioplasty implant and graft: Secondary | ICD-10-CM | POA: Diagnosis not present

## 2022-06-30 DIAGNOSIS — I10 Essential (primary) hypertension: Secondary | ICD-10-CM | POA: Diagnosis not present

## 2022-06-30 DIAGNOSIS — I2582 Chronic total occlusion of coronary artery: Secondary | ICD-10-CM | POA: Diagnosis not present

## 2022-08-15 DIAGNOSIS — I2510 Atherosclerotic heart disease of native coronary artery without angina pectoris: Secondary | ICD-10-CM | POA: Diagnosis not present

## 2022-08-15 DIAGNOSIS — Z86010 Personal history of colonic polyps: Secondary | ICD-10-CM | POA: Diagnosis not present

## 2022-08-16 DIAGNOSIS — E1136 Type 2 diabetes mellitus with diabetic cataract: Secondary | ICD-10-CM | POA: Diagnosis not present

## 2022-08-16 DIAGNOSIS — I25118 Atherosclerotic heart disease of native coronary artery with other forms of angina pectoris: Secondary | ICD-10-CM | POA: Diagnosis not present

## 2022-08-16 DIAGNOSIS — I10 Essential (primary) hypertension: Secondary | ICD-10-CM | POA: Diagnosis not present

## 2022-08-16 DIAGNOSIS — E7800 Pure hypercholesterolemia, unspecified: Secondary | ICD-10-CM | POA: Diagnosis not present

## 2022-08-17 DIAGNOSIS — L905 Scar conditions and fibrosis of skin: Secondary | ICD-10-CM | POA: Diagnosis not present

## 2022-08-17 DIAGNOSIS — H02423 Myogenic ptosis of bilateral eyelids: Secondary | ICD-10-CM | POA: Diagnosis not present

## 2022-08-17 DIAGNOSIS — H0279 Other degenerative disorders of eyelid and periocular area: Secondary | ICD-10-CM | POA: Diagnosis not present

## 2022-08-17 DIAGNOSIS — Z09 Encounter for follow-up examination after completed treatment for conditions other than malignant neoplasm: Secondary | ICD-10-CM | POA: Diagnosis not present

## 2022-08-31 DIAGNOSIS — K644 Residual hemorrhoidal skin tags: Secondary | ICD-10-CM | POA: Diagnosis not present

## 2022-08-31 DIAGNOSIS — K648 Other hemorrhoids: Secondary | ICD-10-CM | POA: Diagnosis not present

## 2022-08-31 DIAGNOSIS — D123 Benign neoplasm of transverse colon: Secondary | ICD-10-CM | POA: Diagnosis not present

## 2022-08-31 DIAGNOSIS — K5730 Diverticulosis of large intestine without perforation or abscess without bleeding: Secondary | ICD-10-CM | POA: Diagnosis not present

## 2022-08-31 DIAGNOSIS — D122 Benign neoplasm of ascending colon: Secondary | ICD-10-CM | POA: Diagnosis not present

## 2022-08-31 DIAGNOSIS — D124 Benign neoplasm of descending colon: Secondary | ICD-10-CM | POA: Diagnosis not present

## 2022-08-31 DIAGNOSIS — Z09 Encounter for follow-up examination after completed treatment for conditions other than malignant neoplasm: Secondary | ICD-10-CM | POA: Diagnosis not present

## 2022-08-31 DIAGNOSIS — Z86010 Personal history of colonic polyps: Secondary | ICD-10-CM | POA: Diagnosis not present

## 2022-08-31 DIAGNOSIS — D125 Benign neoplasm of sigmoid colon: Secondary | ICD-10-CM | POA: Diagnosis not present

## 2022-09-04 DIAGNOSIS — D122 Benign neoplasm of ascending colon: Secondary | ICD-10-CM | POA: Diagnosis not present

## 2022-09-04 DIAGNOSIS — D124 Benign neoplasm of descending colon: Secondary | ICD-10-CM | POA: Diagnosis not present

## 2022-09-04 DIAGNOSIS — Z1330 Encounter for screening examination for mental health and behavioral disorders, unspecified: Secondary | ICD-10-CM | POA: Diagnosis not present

## 2022-09-04 DIAGNOSIS — I2510 Atherosclerotic heart disease of native coronary artery without angina pectoris: Secondary | ICD-10-CM | POA: Diagnosis not present

## 2022-09-04 DIAGNOSIS — Z955 Presence of coronary angioplasty implant and graft: Secondary | ICD-10-CM | POA: Diagnosis not present

## 2022-09-04 DIAGNOSIS — I255 Ischemic cardiomyopathy: Secondary | ICD-10-CM | POA: Diagnosis not present

## 2022-09-04 DIAGNOSIS — E782 Mixed hyperlipidemia: Secondary | ICD-10-CM | POA: Diagnosis not present

## 2022-09-04 DIAGNOSIS — I2582 Chronic total occlusion of coronary artery: Secondary | ICD-10-CM | POA: Diagnosis not present

## 2022-09-04 DIAGNOSIS — R0602 Shortness of breath: Secondary | ICD-10-CM | POA: Diagnosis not present

## 2022-09-04 DIAGNOSIS — D123 Benign neoplasm of transverse colon: Secondary | ICD-10-CM | POA: Diagnosis not present

## 2022-09-04 DIAGNOSIS — D125 Benign neoplasm of sigmoid colon: Secondary | ICD-10-CM | POA: Diagnosis not present

## 2022-09-04 DIAGNOSIS — I10 Essential (primary) hypertension: Secondary | ICD-10-CM | POA: Diagnosis not present

## 2022-09-07 DIAGNOSIS — E1129 Type 2 diabetes mellitus with other diabetic kidney complication: Secondary | ICD-10-CM | POA: Diagnosis not present

## 2022-09-07 DIAGNOSIS — I10 Essential (primary) hypertension: Secondary | ICD-10-CM | POA: Diagnosis not present

## 2022-09-07 DIAGNOSIS — E7800 Pure hypercholesterolemia, unspecified: Secondary | ICD-10-CM | POA: Diagnosis not present

## 2022-09-07 DIAGNOSIS — Z79899 Other long term (current) drug therapy: Secondary | ICD-10-CM | POA: Diagnosis not present

## 2022-09-12 DIAGNOSIS — D125 Benign neoplasm of sigmoid colon: Secondary | ICD-10-CM | POA: Diagnosis not present

## 2022-09-12 DIAGNOSIS — Z86010 Personal history of colonic polyps: Secondary | ICD-10-CM | POA: Diagnosis not present

## 2022-09-12 DIAGNOSIS — K5730 Diverticulosis of large intestine without perforation or abscess without bleeding: Secondary | ICD-10-CM | POA: Diagnosis not present

## 2022-09-12 DIAGNOSIS — K644 Residual hemorrhoidal skin tags: Secondary | ICD-10-CM | POA: Diagnosis not present

## 2022-09-12 DIAGNOSIS — Z09 Encounter for follow-up examination after completed treatment for conditions other than malignant neoplasm: Secondary | ICD-10-CM | POA: Diagnosis not present

## 2022-09-12 DIAGNOSIS — D123 Benign neoplasm of transverse colon: Secondary | ICD-10-CM | POA: Diagnosis not present

## 2022-09-12 DIAGNOSIS — D122 Benign neoplasm of ascending colon: Secondary | ICD-10-CM | POA: Diagnosis not present

## 2022-09-12 DIAGNOSIS — D124 Benign neoplasm of descending colon: Secondary | ICD-10-CM | POA: Diagnosis not present

## 2022-09-12 DIAGNOSIS — K648 Other hemorrhoids: Secondary | ICD-10-CM | POA: Diagnosis not present

## 2022-09-14 DIAGNOSIS — E1129 Type 2 diabetes mellitus with other diabetic kidney complication: Secondary | ICD-10-CM | POA: Diagnosis not present

## 2022-09-14 DIAGNOSIS — Z9861 Coronary angioplasty status: Secondary | ICD-10-CM | POA: Diagnosis not present

## 2022-09-14 DIAGNOSIS — E7800 Pure hypercholesterolemia, unspecified: Secondary | ICD-10-CM | POA: Diagnosis not present

## 2022-09-14 DIAGNOSIS — I2510 Atherosclerotic heart disease of native coronary artery without angina pectoris: Secondary | ICD-10-CM | POA: Diagnosis not present

## 2022-09-14 DIAGNOSIS — I10 Essential (primary) hypertension: Secondary | ICD-10-CM | POA: Diagnosis not present

## 2022-09-14 DIAGNOSIS — E875 Hyperkalemia: Secondary | ICD-10-CM | POA: Diagnosis not present

## 2022-09-14 DIAGNOSIS — Z79899 Other long term (current) drug therapy: Secondary | ICD-10-CM | POA: Diagnosis not present

## 2022-10-17 DIAGNOSIS — D225 Melanocytic nevi of trunk: Secondary | ICD-10-CM | POA: Diagnosis not present

## 2022-10-17 DIAGNOSIS — L821 Other seborrheic keratosis: Secondary | ICD-10-CM | POA: Diagnosis not present

## 2022-10-31 DIAGNOSIS — Z955 Presence of coronary angioplasty implant and graft: Secondary | ICD-10-CM | POA: Diagnosis not present

## 2022-10-31 DIAGNOSIS — I2510 Atherosclerotic heart disease of native coronary artery without angina pectoris: Secondary | ICD-10-CM | POA: Diagnosis not present

## 2022-10-31 DIAGNOSIS — I2582 Chronic total occlusion of coronary artery: Secondary | ICD-10-CM | POA: Diagnosis not present

## 2022-10-31 DIAGNOSIS — I10 Essential (primary) hypertension: Secondary | ICD-10-CM | POA: Diagnosis not present

## 2022-10-31 DIAGNOSIS — E782 Mixed hyperlipidemia: Secondary | ICD-10-CM | POA: Diagnosis not present

## 2022-11-23 DIAGNOSIS — Z961 Presence of intraocular lens: Secondary | ICD-10-CM | POA: Diagnosis not present

## 2022-11-23 DIAGNOSIS — H26491 Other secondary cataract, right eye: Secondary | ICD-10-CM | POA: Diagnosis not present

## 2022-11-23 DIAGNOSIS — H04123 Dry eye syndrome of bilateral lacrimal glands: Secondary | ICD-10-CM | POA: Diagnosis not present

## 2022-11-23 DIAGNOSIS — E113393 Type 2 diabetes mellitus with moderate nonproliferative diabetic retinopathy without macular edema, bilateral: Secondary | ICD-10-CM | POA: Diagnosis not present

## 2022-11-23 DIAGNOSIS — H43813 Vitreous degeneration, bilateral: Secondary | ICD-10-CM | POA: Diagnosis not present

## 2022-11-30 DIAGNOSIS — Z125 Encounter for screening for malignant neoplasm of prostate: Secondary | ICD-10-CM | POA: Diagnosis not present

## 2022-11-30 DIAGNOSIS — E118 Type 2 diabetes mellitus with unspecified complications: Secondary | ICD-10-CM | POA: Diagnosis not present

## 2022-12-04 DIAGNOSIS — I700 Atherosclerosis of aorta: Secondary | ICD-10-CM | POA: Diagnosis not present

## 2022-12-04 DIAGNOSIS — Z0000 Encounter for general adult medical examination without abnormal findings: Secondary | ICD-10-CM | POA: Diagnosis not present

## 2022-12-04 DIAGNOSIS — D692 Other nonthrombocytopenic purpura: Secondary | ICD-10-CM | POA: Diagnosis not present

## 2022-12-04 DIAGNOSIS — Z23 Encounter for immunization: Secondary | ICD-10-CM | POA: Diagnosis not present

## 2022-12-04 DIAGNOSIS — Z86010 Personal history of colonic polyps: Secondary | ICD-10-CM | POA: Diagnosis not present

## 2022-12-04 DIAGNOSIS — Z8551 Personal history of malignant neoplasm of bladder: Secondary | ICD-10-CM | POA: Diagnosis not present

## 2022-12-04 DIAGNOSIS — I10 Essential (primary) hypertension: Secondary | ICD-10-CM | POA: Diagnosis not present

## 2022-12-04 DIAGNOSIS — I2510 Atherosclerotic heart disease of native coronary artery without angina pectoris: Secondary | ICD-10-CM | POA: Diagnosis not present

## 2022-12-04 DIAGNOSIS — E1136 Type 2 diabetes mellitus with diabetic cataract: Secondary | ICD-10-CM | POA: Diagnosis not present

## 2023-01-10 DIAGNOSIS — Z8551 Personal history of malignant neoplasm of bladder: Secondary | ICD-10-CM | POA: Diagnosis not present

## 2023-01-10 DIAGNOSIS — R8289 Other abnormal findings on cytological and histological examination of urine: Secondary | ICD-10-CM | POA: Diagnosis not present

## 2023-02-05 DIAGNOSIS — M7022 Olecranon bursitis, left elbow: Secondary | ICD-10-CM | POA: Diagnosis not present

## 2023-03-08 DIAGNOSIS — E1129 Type 2 diabetes mellitus with other diabetic kidney complication: Secondary | ICD-10-CM | POA: Diagnosis not present

## 2023-03-08 DIAGNOSIS — E7800 Pure hypercholesterolemia, unspecified: Secondary | ICD-10-CM | POA: Diagnosis not present

## 2023-03-08 DIAGNOSIS — I10 Essential (primary) hypertension: Secondary | ICD-10-CM | POA: Diagnosis not present

## 2023-03-08 DIAGNOSIS — Z79899 Other long term (current) drug therapy: Secondary | ICD-10-CM | POA: Diagnosis not present

## 2023-03-27 DIAGNOSIS — E7841 Elevated Lipoprotein(a): Secondary | ICD-10-CM | POA: Diagnosis not present

## 2023-03-27 DIAGNOSIS — D649 Anemia, unspecified: Secondary | ICD-10-CM | POA: Diagnosis not present

## 2023-03-27 DIAGNOSIS — E1129 Type 2 diabetes mellitus with other diabetic kidney complication: Secondary | ICD-10-CM | POA: Diagnosis not present

## 2023-03-27 DIAGNOSIS — E7800 Pure hypercholesterolemia, unspecified: Secondary | ICD-10-CM | POA: Diagnosis not present

## 2023-03-27 DIAGNOSIS — Z9861 Coronary angioplasty status: Secondary | ICD-10-CM | POA: Diagnosis not present

## 2023-03-27 DIAGNOSIS — I2510 Atherosclerotic heart disease of native coronary artery without angina pectoris: Secondary | ICD-10-CM | POA: Diagnosis not present

## 2023-03-27 DIAGNOSIS — I10 Essential (primary) hypertension: Secondary | ICD-10-CM | POA: Diagnosis not present

## 2023-05-01 DIAGNOSIS — I2582 Chronic total occlusion of coronary artery: Secondary | ICD-10-CM | POA: Diagnosis not present

## 2023-05-01 DIAGNOSIS — I2510 Atherosclerotic heart disease of native coronary artery without angina pectoris: Secondary | ICD-10-CM | POA: Diagnosis not present

## 2023-05-01 DIAGNOSIS — Z955 Presence of coronary angioplasty implant and graft: Secondary | ICD-10-CM | POA: Diagnosis not present

## 2023-05-01 DIAGNOSIS — E7800 Pure hypercholesterolemia, unspecified: Secondary | ICD-10-CM | POA: Diagnosis not present

## 2023-05-08 DIAGNOSIS — X32XXXA Exposure to sunlight, initial encounter: Secondary | ICD-10-CM | POA: Diagnosis not present

## 2023-05-08 DIAGNOSIS — L84 Corns and callosities: Secondary | ICD-10-CM | POA: Diagnosis not present

## 2023-05-08 DIAGNOSIS — L570 Actinic keratosis: Secondary | ICD-10-CM | POA: Diagnosis not present

## 2023-05-18 DIAGNOSIS — I2510 Atherosclerotic heart disease of native coronary artery without angina pectoris: Secondary | ICD-10-CM | POA: Diagnosis not present

## 2023-05-18 DIAGNOSIS — I2582 Chronic total occlusion of coronary artery: Secondary | ICD-10-CM | POA: Diagnosis not present

## 2023-05-18 DIAGNOSIS — Z955 Presence of coronary angioplasty implant and graft: Secondary | ICD-10-CM | POA: Diagnosis not present

## 2023-06-19 DIAGNOSIS — I10 Essential (primary) hypertension: Secondary | ICD-10-CM | POA: Diagnosis not present

## 2023-06-19 DIAGNOSIS — E7800 Pure hypercholesterolemia, unspecified: Secondary | ICD-10-CM | POA: Diagnosis not present

## 2023-06-19 DIAGNOSIS — D649 Anemia, unspecified: Secondary | ICD-10-CM | POA: Diagnosis not present

## 2023-06-19 DIAGNOSIS — E1129 Type 2 diabetes mellitus with other diabetic kidney complication: Secondary | ICD-10-CM | POA: Diagnosis not present

## 2023-06-26 DIAGNOSIS — E7800 Pure hypercholesterolemia, unspecified: Secondary | ICD-10-CM | POA: Diagnosis not present

## 2023-06-26 DIAGNOSIS — Z9861 Coronary angioplasty status: Secondary | ICD-10-CM | POA: Diagnosis not present

## 2023-06-26 DIAGNOSIS — E1129 Type 2 diabetes mellitus with other diabetic kidney complication: Secondary | ICD-10-CM | POA: Diagnosis not present

## 2023-06-26 DIAGNOSIS — I10 Essential (primary) hypertension: Secondary | ICD-10-CM | POA: Diagnosis not present

## 2023-06-26 DIAGNOSIS — I2510 Atherosclerotic heart disease of native coronary artery without angina pectoris: Secondary | ICD-10-CM | POA: Diagnosis not present

## 2023-06-26 DIAGNOSIS — E7841 Elevated Lipoprotein(a): Secondary | ICD-10-CM | POA: Diagnosis not present

## 2023-07-26 DIAGNOSIS — H04123 Dry eye syndrome of bilateral lacrimal glands: Secondary | ICD-10-CM | POA: Diagnosis not present

## 2023-07-26 DIAGNOSIS — E113393 Type 2 diabetes mellitus with moderate nonproliferative diabetic retinopathy without macular edema, bilateral: Secondary | ICD-10-CM | POA: Diagnosis not present

## 2023-08-09 DIAGNOSIS — Z7901 Long term (current) use of anticoagulants: Secondary | ICD-10-CM | POA: Diagnosis not present

## 2023-08-09 DIAGNOSIS — I2510 Atherosclerotic heart disease of native coronary artery without angina pectoris: Secondary | ICD-10-CM | POA: Diagnosis not present

## 2023-08-09 DIAGNOSIS — Z860101 Personal history of adenomatous and serrated colon polyps: Secondary | ICD-10-CM | POA: Diagnosis not present

## 2023-08-30 DIAGNOSIS — Z9861 Coronary angioplasty status: Secondary | ICD-10-CM | POA: Diagnosis not present

## 2023-08-30 DIAGNOSIS — I10 Essential (primary) hypertension: Secondary | ICD-10-CM | POA: Diagnosis not present

## 2023-08-30 DIAGNOSIS — E1129 Type 2 diabetes mellitus with other diabetic kidney complication: Secondary | ICD-10-CM | POA: Diagnosis not present

## 2023-08-30 DIAGNOSIS — I2510 Atherosclerotic heart disease of native coronary artery without angina pectoris: Secondary | ICD-10-CM | POA: Diagnosis not present

## 2023-08-30 DIAGNOSIS — E7841 Elevated Lipoprotein(a): Secondary | ICD-10-CM | POA: Diagnosis not present

## 2023-08-30 DIAGNOSIS — E7800 Pure hypercholesterolemia, unspecified: Secondary | ICD-10-CM | POA: Diagnosis not present

## 2023-09-03 DIAGNOSIS — D124 Benign neoplasm of descending colon: Secondary | ICD-10-CM | POA: Diagnosis not present

## 2023-09-03 DIAGNOSIS — K5730 Diverticulosis of large intestine without perforation or abscess without bleeding: Secondary | ICD-10-CM | POA: Diagnosis not present

## 2023-09-03 DIAGNOSIS — Z860101 Personal history of adenomatous and serrated colon polyps: Secondary | ICD-10-CM | POA: Diagnosis not present

## 2023-09-03 DIAGNOSIS — K635 Polyp of colon: Secondary | ICD-10-CM | POA: Diagnosis not present

## 2023-09-03 DIAGNOSIS — Z09 Encounter for follow-up examination after completed treatment for conditions other than malignant neoplasm: Secondary | ICD-10-CM | POA: Diagnosis not present

## 2023-09-03 DIAGNOSIS — D125 Benign neoplasm of sigmoid colon: Secondary | ICD-10-CM | POA: Diagnosis not present

## 2023-09-05 DIAGNOSIS — D124 Benign neoplasm of descending colon: Secondary | ICD-10-CM | POA: Diagnosis not present

## 2023-09-05 DIAGNOSIS — K635 Polyp of colon: Secondary | ICD-10-CM | POA: Diagnosis not present

## 2023-09-05 DIAGNOSIS — D125 Benign neoplasm of sigmoid colon: Secondary | ICD-10-CM | POA: Diagnosis not present

## 2023-10-09 DIAGNOSIS — E7800 Pure hypercholesterolemia, unspecified: Secondary | ICD-10-CM | POA: Diagnosis not present

## 2023-10-09 DIAGNOSIS — I2510 Atherosclerotic heart disease of native coronary artery without angina pectoris: Secondary | ICD-10-CM | POA: Diagnosis not present

## 2023-10-09 DIAGNOSIS — E7841 Elevated Lipoprotein(a): Secondary | ICD-10-CM | POA: Diagnosis not present

## 2023-10-09 DIAGNOSIS — I10 Essential (primary) hypertension: Secondary | ICD-10-CM | POA: Diagnosis not present

## 2023-10-09 DIAGNOSIS — E1129 Type 2 diabetes mellitus with other diabetic kidney complication: Secondary | ICD-10-CM | POA: Diagnosis not present

## 2023-11-29 DIAGNOSIS — E7800 Pure hypercholesterolemia, unspecified: Secondary | ICD-10-CM | POA: Diagnosis not present

## 2023-11-29 DIAGNOSIS — I2582 Chronic total occlusion of coronary artery: Secondary | ICD-10-CM | POA: Diagnosis not present

## 2023-11-29 DIAGNOSIS — Z955 Presence of coronary angioplasty implant and graft: Secondary | ICD-10-CM | POA: Diagnosis not present

## 2023-12-06 DIAGNOSIS — I10 Essential (primary) hypertension: Secondary | ICD-10-CM | POA: Diagnosis not present

## 2023-12-06 DIAGNOSIS — Z125 Encounter for screening for malignant neoplasm of prostate: Secondary | ICD-10-CM | POA: Diagnosis not present

## 2023-12-20 DIAGNOSIS — I10 Essential (primary) hypertension: Secondary | ICD-10-CM | POA: Diagnosis not present

## 2023-12-20 DIAGNOSIS — R3129 Other microscopic hematuria: Secondary | ICD-10-CM | POA: Diagnosis not present

## 2023-12-20 DIAGNOSIS — E1136 Type 2 diabetes mellitus with diabetic cataract: Secondary | ICD-10-CM | POA: Diagnosis not present

## 2023-12-20 DIAGNOSIS — Z8551 Personal history of malignant neoplasm of bladder: Secondary | ICD-10-CM | POA: Diagnosis not present

## 2023-12-20 DIAGNOSIS — Z0000 Encounter for general adult medical examination without abnormal findings: Secondary | ICD-10-CM | POA: Diagnosis not present

## 2023-12-20 DIAGNOSIS — E789 Disorder of lipoprotein metabolism, unspecified: Secondary | ICD-10-CM | POA: Diagnosis not present

## 2023-12-20 DIAGNOSIS — Z955 Presence of coronary angioplasty implant and graft: Secondary | ICD-10-CM | POA: Diagnosis not present

## 2023-12-20 DIAGNOSIS — I700 Atherosclerosis of aorta: Secondary | ICD-10-CM | POA: Diagnosis not present

## 2024-01-09 DIAGNOSIS — C678 Malignant neoplasm of overlapping sites of bladder: Secondary | ICD-10-CM | POA: Diagnosis not present

## 2024-01-09 DIAGNOSIS — Z8551 Personal history of malignant neoplasm of bladder: Secondary | ICD-10-CM | POA: Diagnosis not present

## 2024-01-28 DIAGNOSIS — Z8551 Personal history of malignant neoplasm of bladder: Secondary | ICD-10-CM | POA: Diagnosis not present

## 2024-01-28 DIAGNOSIS — E113393 Type 2 diabetes mellitus with moderate nonproliferative diabetic retinopathy without macular edema, bilateral: Secondary | ICD-10-CM | POA: Diagnosis not present

## 2024-01-28 DIAGNOSIS — H04123 Dry eye syndrome of bilateral lacrimal glands: Secondary | ICD-10-CM | POA: Diagnosis not present

## 2024-01-28 DIAGNOSIS — Z961 Presence of intraocular lens: Secondary | ICD-10-CM | POA: Diagnosis not present

## 2024-01-28 DIAGNOSIS — R300 Dysuria: Secondary | ICD-10-CM | POA: Diagnosis not present

## 2024-02-07 DIAGNOSIS — E1129 Type 2 diabetes mellitus with other diabetic kidney complication: Secondary | ICD-10-CM | POA: Diagnosis not present

## 2024-02-07 DIAGNOSIS — L989 Disorder of the skin and subcutaneous tissue, unspecified: Secondary | ICD-10-CM | POA: Diagnosis not present

## 2024-02-07 DIAGNOSIS — R300 Dysuria: Secondary | ICD-10-CM | POA: Diagnosis not present

## 2024-02-07 DIAGNOSIS — E7800 Pure hypercholesterolemia, unspecified: Secondary | ICD-10-CM | POA: Diagnosis not present

## 2024-02-07 DIAGNOSIS — L270 Generalized skin eruption due to drugs and medicaments taken internally: Secondary | ICD-10-CM | POA: Diagnosis not present

## 2024-02-07 DIAGNOSIS — I10 Essential (primary) hypertension: Secondary | ICD-10-CM | POA: Diagnosis not present

## 2024-02-07 DIAGNOSIS — I2510 Atherosclerotic heart disease of native coronary artery without angina pectoris: Secondary | ICD-10-CM | POA: Diagnosis not present

## 2024-04-10 DIAGNOSIS — Z955 Presence of coronary angioplasty implant and graft: Secondary | ICD-10-CM | POA: Diagnosis not present

## 2024-04-10 DIAGNOSIS — E1129 Type 2 diabetes mellitus with other diabetic kidney complication: Secondary | ICD-10-CM | POA: Diagnosis not present

## 2024-04-10 DIAGNOSIS — I2510 Atherosclerotic heart disease of native coronary artery without angina pectoris: Secondary | ICD-10-CM | POA: Diagnosis not present

## 2024-04-10 DIAGNOSIS — I10 Essential (primary) hypertension: Secondary | ICD-10-CM | POA: Diagnosis not present

## 2024-04-10 DIAGNOSIS — Z9861 Coronary angioplasty status: Secondary | ICD-10-CM | POA: Diagnosis not present

## 2024-04-10 DIAGNOSIS — E7841 Elevated Lipoprotein(a): Secondary | ICD-10-CM | POA: Diagnosis not present

## 2024-04-10 DIAGNOSIS — E789 Disorder of lipoprotein metabolism, unspecified: Secondary | ICD-10-CM | POA: Diagnosis not present

## 2024-08-12 ENCOUNTER — Other Ambulatory Visit: Payer: Self-pay | Admitting: Urology

## 2024-09-02 ENCOUNTER — Encounter (HOSPITAL_COMMUNITY)

## 2024-09-11 ENCOUNTER — Ambulatory Visit (HOSPITAL_COMMUNITY): Admit: 2024-09-11 | Admitting: Urology
